# Patient Record
Sex: Male | Born: 1955 | Race: Black or African American | Hispanic: No | Marital: Single | Smoking: Never smoker
Health system: Southern US, Community
[De-identification: ages and names within clinical notes are randomized; demographics above are authoritative.]

## PROBLEM LIST (undated history)

## (undated) DIAGNOSIS — F209 Schizophrenia, unspecified: Secondary | ICD-10-CM

## (undated) DIAGNOSIS — R9431 Abnormal electrocardiogram [ECG] [EKG]: Secondary | ICD-10-CM

## (undated) DIAGNOSIS — R0602 Shortness of breath: Secondary | ICD-10-CM

## (undated) DIAGNOSIS — F79 Unspecified intellectual disabilities: Secondary | ICD-10-CM

---

## 1997-08-17 ENCOUNTER — Encounter: Admission: RE | Admit: 1997-08-17 | Discharge: 1997-08-17 | Payer: Self-pay | Admitting: Family Medicine

## 2009-08-17 ENCOUNTER — Emergency Department (HOSPITAL_COMMUNITY): Admission: EM | Admit: 2009-08-17 | Discharge: 2009-08-17 | Payer: Self-pay | Admitting: Emergency Medicine

## 2010-07-10 LAB — POCT I-STAT, CHEM 8
Creatinine, Ser: 1.1 mg/dL (ref 0.4–1.5)
HCT: 47 % (ref 39.0–52.0)
Hemoglobin: 16 g/dL (ref 13.0–17.0)
Potassium: 3.4 mEq/L — ABNORMAL LOW (ref 3.5–5.1)
Sodium: 137 mEq/L (ref 135–145)
TCO2: 25 mmol/L (ref 0–100)

## 2010-11-28 ENCOUNTER — Ambulatory Visit (INDEPENDENT_AMBULATORY_CARE_PROVIDER_SITE_OTHER): Payer: Medicare Other

## 2010-11-28 ENCOUNTER — Inpatient Hospital Stay (INDEPENDENT_AMBULATORY_CARE_PROVIDER_SITE_OTHER)
Admission: RE | Admit: 2010-11-28 | Discharge: 2010-11-28 | Disposition: A | Discharge status: Home / Self Care | Payer: Medicare Other | Source: Ambulatory Visit | Attending: Emergency Medicine | Admitting: Emergency Medicine

## 2010-11-28 DIAGNOSIS — S63259A Unspecified dislocation of unspecified finger, initial encounter: Secondary | ICD-10-CM

## 2011-01-30 ENCOUNTER — Inpatient Hospital Stay (INDEPENDENT_AMBULATORY_CARE_PROVIDER_SITE_OTHER)
Admission: RE | Admit: 2011-01-30 | Discharge: 2011-01-30 | Disposition: A | Discharge status: Home / Self Care | Payer: Medicare Other | Source: Ambulatory Visit | Attending: Family Medicine | Admitting: Family Medicine

## 2011-09-13 DIAGNOSIS — F209 Schizophrenia, unspecified: Secondary | ICD-10-CM | POA: Diagnosis not present

## 2011-09-30 DIAGNOSIS — F209 Schizophrenia, unspecified: Secondary | ICD-10-CM | POA: Diagnosis not present

## 2012-03-10 DIAGNOSIS — Z13220 Encounter for screening for lipoid disorders: Secondary | ICD-10-CM | POA: Diagnosis not present

## 2012-03-10 DIAGNOSIS — F411 Generalized anxiety disorder: Secondary | ICD-10-CM | POA: Diagnosis not present

## 2012-03-11 DIAGNOSIS — H539 Unspecified visual disturbance: Secondary | ICD-10-CM | POA: Diagnosis not present

## 2012-03-11 DIAGNOSIS — Z131 Encounter for screening for diabetes mellitus: Secondary | ICD-10-CM | POA: Diagnosis not present

## 2012-03-11 DIAGNOSIS — Z136 Encounter for screening for cardiovascular disorders: Secondary | ICD-10-CM | POA: Diagnosis not present

## 2012-03-26 DIAGNOSIS — F209 Schizophrenia, unspecified: Secondary | ICD-10-CM | POA: Diagnosis not present

## 2012-04-02 DIAGNOSIS — H538 Other visual disturbances: Secondary | ICD-10-CM | POA: Diagnosis not present

## 2012-07-16 DIAGNOSIS — F209 Schizophrenia, unspecified: Secondary | ICD-10-CM | POA: Diagnosis not present

## 2012-09-10 DIAGNOSIS — Z131 Encounter for screening for diabetes mellitus: Secondary | ICD-10-CM | POA: Diagnosis not present

## 2012-09-10 DIAGNOSIS — R259 Unspecified abnormal involuntary movements: Secondary | ICD-10-CM | POA: Diagnosis not present

## 2012-09-10 DIAGNOSIS — Z136 Encounter for screening for cardiovascular disorders: Secondary | ICD-10-CM | POA: Diagnosis not present

## 2012-09-15 DIAGNOSIS — R259 Unspecified abnormal involuntary movements: Secondary | ICD-10-CM | POA: Diagnosis not present

## 2012-10-29 DIAGNOSIS — R259 Unspecified abnormal involuntary movements: Secondary | ICD-10-CM | POA: Diagnosis not present

## 2012-11-02 DIAGNOSIS — F209 Schizophrenia, unspecified: Secondary | ICD-10-CM | POA: Diagnosis not present

## 2012-12-26 ENCOUNTER — Encounter (HOSPITAL_COMMUNITY): Payer: Self-pay | Admitting: Emergency Medicine

## 2012-12-26 ENCOUNTER — Emergency Department (HOSPITAL_COMMUNITY)
Admission: EM | Admit: 2012-12-26 | Discharge: 2012-12-27 | Disposition: A | Discharge status: Home / Self Care | Payer: Medicare Other | Attending: Emergency Medicine | Admitting: Emergency Medicine

## 2012-12-26 DIAGNOSIS — Z79899 Other long term (current) drug therapy: Secondary | ICD-10-CM | POA: Insufficient documentation

## 2012-12-26 DIAGNOSIS — R42 Dizziness and giddiness: Secondary | ICD-10-CM | POA: Diagnosis not present

## 2012-12-26 DIAGNOSIS — T699XXA Effect of reduced temperature, unspecified, initial encounter: Secondary | ICD-10-CM | POA: Diagnosis not present

## 2012-12-26 DIAGNOSIS — E860 Dehydration: Secondary | ICD-10-CM | POA: Insufficient documentation

## 2012-12-26 DIAGNOSIS — F209 Schizophrenia, unspecified: Secondary | ICD-10-CM | POA: Insufficient documentation

## 2012-12-26 DIAGNOSIS — R112 Nausea with vomiting, unspecified: Secondary | ICD-10-CM | POA: Diagnosis not present

## 2012-12-26 HISTORY — DX: Unspecified intellectual disabilities: F79

## 2012-12-26 HISTORY — DX: Schizophrenia, unspecified: F20.9

## 2012-12-26 LAB — BASIC METABOLIC PANEL
BUN: 10 mg/dL (ref 6–23)
CO2: 27 mEq/L (ref 19–32)
Calcium: 8.2 mg/dL — ABNORMAL LOW (ref 8.4–10.5)
Chloride: 105 mEq/L (ref 96–112)
Creatinine, Ser: 1.11 mg/dL (ref 0.50–1.35)
GFR calc Af Amer: 84 mL/min — ABNORMAL LOW (ref >90)
GFR calc non Af Amer: 72 mL/min — ABNORMAL LOW (ref >90)
Potassium: 3.8 mEq/L (ref 3.5–5.1)
Sodium: 139 mEq/L (ref 135–145)

## 2012-12-26 LAB — CBC
Hemoglobin: 12 g/dL — ABNORMAL LOW (ref 13.0–17.0)
MCH: 23.4 pg — ABNORMAL LOW (ref 26.0–34.0)
Platelets: 135 10*3/uL — ABNORMAL LOW (ref 150–400)
RBC: 5.13 MIL/uL (ref 4.22–5.81)
WBC: 6.8 10*3/uL (ref 4.0–10.5)

## 2012-12-26 MED ORDER — ONDANSETRON HCL 4 MG/2ML IJ SOLN
4.0000 mg | Freq: Once | INTRAMUSCULAR | Status: AC
  Administered 2012-12-26: 4 mg via INTRAVENOUS
  Filled 2012-12-26: qty 2

## 2012-12-26 MED ORDER — SODIUM CHLORIDE 0.9 % IV BOLUS (SEPSIS)
1000.0000 mL | Freq: Once | INTRAVENOUS | Status: AC
  Administered 2012-12-26: 1000 mL via INTRAVENOUS

## 2012-12-26 NOTE — ED Provider Notes (Signed)
CSN: [id number]     Arrival date & time 12/26/12  1905 History   First MD Initiated Contact with Patient 12/26/12 2048     Chief Complaint  Patient presents with  . Dizziness  . Nausea  . Emesis   (Consider location/radiation/quality/duration/timing/severity/associated sxs/prior Treatment) HPI 57 year old male with a past medical history of schizophrenia that presents for evaluation due to an episode that involved nausea, one episode of NB/NB emesis and "feeling hot".  The patient was under a bridge eating lunch with some of his friends at the time. He denies using any illicit drugs today. He does report that he was outside in the sun for a significant portion of the day. He did not have any chest pain, shortness of breath, abdominal pain or other complaints at the time. He reports he is feeling better currently. He reports his friends gave him water and tried to cool him down.  Of note he is a poor historian and is unable to provide many details.     Past Medical History  Diagnosis Date  . Schizophrenia   . Mentally challenged    No past surgical history on file. No family history on file. History  Substance Use Topics  . Smoking status: Not on file  . Smokeless tobacco: Not on file  . Alcohol Use: Not on file    Review of Systems  Constitutional: Negative for fever and chills.  HENT: Negative for neck pain.   Eyes: Negative for photophobia and visual disturbance.  Respiratory: Negative for chest tightness and shortness of breath.   Cardiovascular: Negative for chest pain and palpitations.  Gastrointestinal: Negative for abdominal pain and diarrhea.  Genitourinary: Negative for dysuria and frequency.  Musculoskeletal: Negative for myalgias and back pain.  Skin: Negative for rash.  Neurological: Negative for weakness, numbness and headaches.  All other systems reviewed and are negative.    Allergies  Review of patient's allergies indicates no known allergies.  Home  Medications   Current Outpatient Rx  Name  Route  Sig  Dispense  Refill  . ARIPiprazole (ABILIFY) 5 MG tablet   Oral   Take 5 mg by mouth daily.         . benztropine (COGENTIN) 1 MG tablet   Oral   Take 1 mg by mouth daily at 8 pm.         . LORazepam (ATIVAN) 0.5 MG tablet   Oral   Take 0.5 mg by mouth 2 (two) times daily.          BP 103/66  Pulse 60  Temp(Src) 98.5 F (36.9 C) (Oral)  Resp 16  SpO2 98% Physical Exam  Vitals reviewed. Constitutional: He is oriented to person, place, and time. He appears well-developed and well-nourished. No distress.  HENT:  Head: Normocephalic.  Right Ear: External ear normal.  Left Ear: External ear normal.  Nose: Nose normal.  Mouth/Throat: Oropharynx is clear and moist. No oropharyngeal exudate.  Eyes: Conjunctivae and EOM are normal. Pupils are equal, round, and reactive to light.  Neck: Normal range of motion. Neck supple.  Cardiovascular: Normal rate, regular rhythm, normal heart sounds and intact distal pulses.  Exam reveals no gallop and no friction rub.   No murmur heard. Pulmonary/Chest: Effort normal and breath sounds normal. No respiratory distress.  Abdominal: Soft. Bowel sounds are normal. He exhibits no distension. There is no tenderness.  Musculoskeletal: Normal range of motion. He exhibits no edema and no tenderness.  Neurological: He is alert and  oriented to person, place, and time. He has normal strength. No cranial nerve deficit or sensory deficit.  Skin: Skin is warm and dry.  Psychiatric: He has a normal mood and affect.    ED Course  Procedures (including critical care time) Labs Review Labs Reviewed  CBC - Abnormal; Notable for the following:    Hemoglobin 12.0 (*)    HCT 35.9 (*)    MCV 70.0 (*)    MCH 23.4 (*)    Platelets 135 (*)    All other components within normal limits  BASIC METABOLIC PANEL - Abnormal; Notable for the following:    Calcium 8.2 (*)    GFR calc non Af Amer 72 (*)     GFR calc Af Amer 84 (*)    All other components within normal limits  CG4 I-STAT (LACTIC ACID)   Imaging Review No results found.   Date: 12/26/2012  Rate: 65  Rhythm: normal sinus rhythm  QRS Axis: normal  Intervals: normal  ST/T Wave abnormalities: normal  Conduction Disutrbances:left anterior fascicular block  Narrative Interpretation: NSR, LAFB, otherwise normal  Old EKG Reviewed: none available   MDM   57 year old male with a past medical history of schizophrenia here for evaluation after a self-limited episode of nausea and vomiting. It is felt that he likely has mild dehydration and possibly had a near syncopal/vasovagal event.  He had no other associated symptoms at the time.  He reports feeling better currently. He is noted to be a poor historian however. Exam is reassuring. Neuro exam is nonfocal. Blood pressure is improving. We'll administer additional IV fluids and anti-emetics. CBC, BMP and EKG pending.  11:14 PM  Patient's blood pressure transiently improved to >100 with IV fluid resuscitation, but has trended back down to the 90s/60s. He reports feeling better. Lactate ordered. I reviewed his chart, but was unable to find any documentation of prior blood pressures. He is unaware of what his normal blood pressure is. Creatinine is normal. He is mentating appropriately. He does not have symptoms with standing.  His heart rate is in the 60s he does not take any rate control medications. It is felt he may live in this range. Her temperature was rechecked and found to be 99.4. If his lactate is normal is felt that no further workup is indicated during this visit the  11:45 PM lactate was normal. Patient reports feeling better and is ready for discharge. It is felt that he is stable for outpatient followup. Return precautions reviewed.  He ambulated from the emergency department with no issues.  Clinical Impression: 1. Dehydration     Disposition: Discharge  Condition:  Good  I have discussed the results, Dx and Tx plan. They expressed understanding and agree with the plan and were told to return to ED with any worsening of condition or concern.    Discharge Medication List as of 12/26/2012 11:42 PM      Follow Up: [facility]     [address] [place] [address]  Schedule an appointment as soon as possible for a visit    Pt seen in conjunction with Dr. Ethelda Chick.  Reine Just. Beverely Pace, MD Emergency Medicine PGY-III [phone]    Oleh Genin, MD 12/27/12 [phone]

## 2012-12-26 NOTE — ED Notes (Signed)
pts belongings placed in pts belongings bag at bedside. Items include three shirt a pair of jeans shoes and sunglasses and a hat.

## 2012-12-26 NOTE — ED Notes (Signed)
Beverely Pace MD at bedside.

## 2012-12-26 NOTE — ED Provider Notes (Signed)
Patient became lightheaded generally weak and nauseated today while in a hot environment. He feels much improved after treatment in the emergency department. He is not lightheaded on standing.  Doug Sou, MD 12/26/12 [phone]

## 2012-12-26 NOTE — ED Notes (Addendum)
Received pt via EMS with c/o dizziness with n/v onset sometime today. Pt unable to tell exact time symptoms started. Pt reports vomited x 4 today. Pt lives in group home at [address] but was picked up by EMS downtown. Pt denies any symptoms at present. Contact person at group home: Deatra Ina [phone].

## 2012-12-27 NOTE — ED Provider Notes (Signed)
I have personally seen and examined the patient.  I have discussed the plan of care with the resident.  I have reviewed the documentation on PMH/FH/Soc. History.  I have reviewed the documentation of the resident and agree.  Valerya Maxton, MD 12/27/12 2346 

## 2013-01-05 DIAGNOSIS — F209 Schizophrenia, unspecified: Secondary | ICD-10-CM | POA: Diagnosis not present

## 2013-02-25 DIAGNOSIS — F209 Schizophrenia, unspecified: Secondary | ICD-10-CM | POA: Diagnosis not present

## 2013-03-02 DIAGNOSIS — F2089 Other schizophrenia: Secondary | ICD-10-CM | POA: Diagnosis not present

## 2013-03-02 DIAGNOSIS — F411 Generalized anxiety disorder: Secondary | ICD-10-CM | POA: Diagnosis not present

## 2013-03-02 DIAGNOSIS — R259 Unspecified abnormal involuntary movements: Secondary | ICD-10-CM | POA: Diagnosis not present

## 2013-03-30 DIAGNOSIS — F209 Schizophrenia, unspecified: Secondary | ICD-10-CM | POA: Diagnosis not present

## 2013-06-18 ENCOUNTER — Encounter (HOSPITAL_COMMUNITY)
Admission: EM | Disposition: A | Discharge status: Other Acute Inpatient Hospital | Payer: Self-pay | Source: Home / Self Care | Attending: Interventional Cardiology

## 2013-06-18 ENCOUNTER — Observation Stay (HOSPITAL_COMMUNITY): Payer: Medicare Other

## 2013-06-18 ENCOUNTER — Encounter (HOSPITAL_COMMUNITY): Payer: Self-pay | Admitting: Emergency Medicine

## 2013-06-18 ENCOUNTER — Inpatient Hospital Stay (HOSPITAL_COMMUNITY)
Admission: EM | Admit: 2013-06-18 | Discharge: 2013-06-20 | DRG: 310 | Payer: Medicare Other | Attending: Interventional Cardiology | Admitting: Interventional Cardiology

## 2013-06-18 DIAGNOSIS — R9431 Abnormal electrocardiogram [ECG] [EKG]: Secondary | ICD-10-CM

## 2013-06-18 DIAGNOSIS — F79 Unspecified intellectual disabilities: Secondary | ICD-10-CM | POA: Diagnosis not present

## 2013-06-18 DIAGNOSIS — Z79899 Other long term (current) drug therapy: Secondary | ICD-10-CM

## 2013-06-18 DIAGNOSIS — F209 Schizophrenia, unspecified: Secondary | ICD-10-CM | POA: Insufficient documentation

## 2013-06-18 DIAGNOSIS — R0789 Other chest pain: Secondary | ICD-10-CM | POA: Diagnosis not present

## 2013-06-18 DIAGNOSIS — R42 Dizziness and giddiness: Secondary | ICD-10-CM

## 2013-06-18 DIAGNOSIS — I209 Angina pectoris, unspecified: Secondary | ICD-10-CM | POA: Diagnosis not present

## 2013-06-18 DIAGNOSIS — I10 Essential (primary) hypertension: Secondary | ICD-10-CM | POA: Diagnosis present

## 2013-06-18 DIAGNOSIS — R404 Transient alteration of awareness: Secondary | ICD-10-CM | POA: Diagnosis not present

## 2013-06-18 DIAGNOSIS — R0602 Shortness of breath: Secondary | ICD-10-CM | POA: Insufficient documentation

## 2013-06-18 DIAGNOSIS — R072 Precordial pain: Secondary | ICD-10-CM | POA: Diagnosis present

## 2013-06-18 DIAGNOSIS — I4510 Unspecified right bundle-branch block: Secondary | ICD-10-CM | POA: Diagnosis not present

## 2013-06-18 DIAGNOSIS — I208 Other forms of angina pectoris: Secondary | ICD-10-CM | POA: Diagnosis present

## 2013-06-18 DIAGNOSIS — I959 Hypotension, unspecified: Secondary | ICD-10-CM | POA: Diagnosis not present

## 2013-06-18 DIAGNOSIS — R079 Chest pain, unspecified: Secondary | ICD-10-CM | POA: Diagnosis not present

## 2013-06-18 DIAGNOSIS — I2089 Other forms of angina pectoris: Secondary | ICD-10-CM | POA: Diagnosis present

## 2013-06-18 DIAGNOSIS — I951 Orthostatic hypotension: Secondary | ICD-10-CM | POA: Diagnosis not present

## 2013-06-18 HISTORY — DX: Abnormal electrocardiogram (ECG) (EKG): R94.31

## 2013-06-18 HISTORY — DX: Shortness of breath: R06.02

## 2013-06-18 HISTORY — PX: LEFT HEART CATHETERIZATION WITH CORONARY ANGIOGRAM: SHX5451

## 2013-06-18 LAB — CBC WITH DIFFERENTIAL/PLATELET
BASOS ABS: 0 10*3/uL (ref 0.0–0.1)
Basophils Relative: 0 % (ref 0–1)
EOS PCT: 0 % (ref 0–5)
Eosinophils Absolute: 0 10*3/uL (ref 0.0–0.7)
HEMATOCRIT: 39.8 % (ref 39.0–52.0)
Hemoglobin: 13.6 g/dL (ref 13.0–17.0)
LYMPHS ABS: 0.2 10*3/uL — AB (ref 0.7–4.0)
LYMPHS PCT: 3 % — AB (ref 12–46)
MCH: 23.9 pg — ABNORMAL LOW (ref 26.0–34.0)
MCHC: 34.2 g/dL (ref 30.0–36.0)
MCV: 69.9 fL — AB (ref 78.0–100.0)
MONOS PCT: 5 % (ref 3–12)
Monocytes Absolute: 0.3 10*3/uL (ref 0.1–1.0)
NEUTROS ABS: 5.8 10*3/uL (ref 1.7–7.7)
Neutrophils Relative %: 92 % — ABNORMAL HIGH (ref 43–77)
Platelets: 170 10*3/uL (ref 150–400)
RBC: 5.69 MIL/uL (ref 4.22–5.81)
RDW: 13.4 % (ref 11.5–15.5)
WBC: 6.3 10*3/uL (ref 4.0–10.5)

## 2013-06-18 LAB — TSH: TSH: 0.913 u[IU]/mL (ref 0.350–4.500)

## 2013-06-18 LAB — BASIC METABOLIC PANEL
BUN: 11 mg/dL (ref 6–23)
CHLORIDE: 97 meq/L (ref 96–112)
CO2: 25 meq/L (ref 19–32)
CREATININE: 1.18 mg/dL (ref 0.50–1.35)
Calcium: 8.9 mg/dL (ref 8.4–10.5)
GFR calc non Af Amer: 67 mL/min — ABNORMAL LOW (ref >90)
GFR, EST AFRICAN AMERICAN: 77 mL/min — AB (ref >90)
Glucose, Bld: 87 mg/dL (ref 70–99)
POTASSIUM: 4.5 meq/L (ref 3.7–5.3)
Sodium: 133 mEq/L — ABNORMAL LOW (ref 137–147)

## 2013-06-18 LAB — I-STAT TROPONIN, ED: TROPONIN I, POC: 0 ng/mL (ref 0.00–0.08)

## 2013-06-18 LAB — MRSA PCR SCREENING: MRSA by PCR: NEGATIVE

## 2013-06-18 LAB — TROPONIN I
Troponin I: <0.3 ng/mL (ref <0.30)
Troponin I: <0.3 ng/mL (ref <0.30)

## 2013-06-18 SURGERY — LEFT HEART CATHETERIZATION WITH CORONARY ANGIOGRAM
Anesthesia: LOCAL

## 2013-06-18 MED ORDER — LORAZEPAM 0.5 MG PO TABS
0.5000 mg | ORAL_TABLET | Freq: Two times a day (BID) | ORAL | Status: DC
  Administered 2013-06-18 – 2013-06-20 (×4): 0.5 mg via ORAL
  Filled 2013-06-18 (×4): qty 1

## 2013-06-18 MED ORDER — ACETAMINOPHEN 325 MG PO TABS: 650.0000 mg | ORAL_TABLET | ORAL | Status: DC | PRN

## 2013-06-18 MED ORDER — ASPIRIN 81 MG PO CHEW
324.0000 mg | CHEWABLE_TABLET | Freq: Once | ORAL | Status: AC
  Administered 2013-06-18: 324 mg via ORAL
  Filled 2013-06-18: qty 4

## 2013-06-18 MED ORDER — SODIUM CHLORIDE 0.9 % IV SOLN
Freq: Once | INTRAVENOUS | Status: AC
  Administered 2013-06-18: 15:00:00 via INTRAVENOUS

## 2013-06-18 MED ORDER — ARIPIPRAZOLE 5 MG PO TABS
5.0000 mg | ORAL_TABLET | Freq: Every day | ORAL | Status: DC
  Administered 2013-06-19 – 2013-06-20 (×2): 5 mg via ORAL
  Filled 2013-06-18 (×2): qty 1

## 2013-06-18 MED ORDER — BENZTROPINE MESYLATE 1 MG PO TABS
1.0000 mg | ORAL_TABLET | Freq: Every day | ORAL | Status: DC
  Administered 2013-06-18 – 2013-06-19 (×2): 1 mg via ORAL
  Filled 2013-06-18 (×3): qty 1

## 2013-06-18 MED ORDER — ONDANSETRON HCL 4 MG/2ML IJ SOLN: 4.0000 mg | Freq: Four times a day (QID) | INTRAMUSCULAR | Status: DC | PRN

## 2013-06-18 MED ORDER — ENOXAPARIN SODIUM 150 MG/ML ~~LOC~~ SOLN: 1.0000 mg/kg | Freq: Two times a day (BID) | SUBCUTANEOUS | Status: DC

## 2013-06-18 MED ORDER — ALPRAZOLAM 0.25 MG PO TABS: 0.2500 mg | ORAL_TABLET | Freq: Two times a day (BID) | ORAL | Status: DC | PRN

## 2013-06-18 MED ORDER — ZOLPIDEM TARTRATE 5 MG PO TABS: 5.0000 mg | ORAL_TABLET | Freq: Every evening | ORAL | Status: DC | PRN

## 2013-06-18 MED ORDER — SODIUM CHLORIDE 0.9 % IV SOLN
Freq: Once | INTRAVENOUS | Status: AC
  Administered 2013-06-18: 19:00:00 via INTRAVENOUS

## 2013-06-18 MED ORDER — ASPIRIN EC 81 MG PO TBEC
81.0000 mg | DELAYED_RELEASE_TABLET | Freq: Every day | ORAL | Status: DC
  Administered 2013-06-19 – 2013-06-20 (×2): 81 mg via ORAL
  Filled 2013-06-18 (×2): qty 1

## 2013-06-18 MED ORDER — ENOXAPARIN SODIUM 60 MG/0.6ML ~~LOC~~ SOLN
60.0000 mg | Freq: Two times a day (BID) | SUBCUTANEOUS | Status: DC
  Administered 2013-06-18 – 2013-06-20 (×3): 60 mg via SUBCUTANEOUS
  Filled 2013-06-18 (×6): qty 0.6

## 2013-06-18 NOTE — ED Notes (Signed)
Nurse first called; [facility] Per [facility], group home coordinator to come.

## 2013-06-18 NOTE — ED Notes (Signed)
PA with Cardiology paged for consult on pt low BP 88/57.

## 2013-06-18 NOTE — H&P (Signed)
History and Physical   Patient ID: Terry Schroeder MRN: [id number], DOB/AGE: 1955-06-23 58 y.o. Date of Encounter: 06/18/2013  Primary Physician: No primary provider on file. Primary Cardiologist: New  Chief Complaint:  Possible MI  HPI: Terry Schroeder is a 58 y.o. male with no history of CAD. He lives at a group home. The caregiver provided most of the information as the patient is not able to remember details very well.   According to her, he was as usual today first thing. He ate a good breakfast and took his usual medications. He had no complaints. When he went to the day room, he began shaking. She does not remember any other complaints. The patient says he was dizzy, c/o cramping in his throat, and SOB. It is unclear if he told anyone that. EMS was called because of the shaking. He never lost consciousness and was able to answer questions.   His ECG had some possible dynamic changes, but it improved by arrival in the ER. In the ER, he was given ASA 81 mg x 4. He still says he is dizzy and has intermittent cramping in his throat. A little SOB, no other symptoms.   Past Medical History  Diagnosis Date  . Schizophrenia   . Mentally challenged     Surgical History:  None known   I have reviewed the patient's current medications. Prior to Admission medications   Medication Sig Start Date End Date Taking? Authorizing Provider  ARIPiprazole (ABILIFY) 5 MG tablet Take 5 mg by mouth daily.    Historical Provider, MD  benztropine (COGENTIN) 1 MG tablet Take 1 mg by mouth at bedtime.     Historical Provider, MD  LORazepam (ATIVAN) 0.5 MG tablet Take 0.5 mg by mouth 2 (two) times daily.    Historical Provider, MD   Allergies: No Known Allergies  History   Social History  . Marital Status: Single    Spouse Name: N/A    Number of Children: N/A  . Years of Education: N/A   Occupational History  . Disabled    Social History Main Topics  . Smoking status: Never Smoker     . Smokeless tobacco: Not on file  . Alcohol Use: No  . Drug Use: No  . Sexual Activity: Not on file   Other Topics Concern  . Not on file   Social History Narrative   Lives in a group home. No information on family history available.     Review of Systems:   Full 14-point review of systems otherwise negative except as noted above.  Physical Exam: Blood pressure 95/66, pulse 70, temperature 98.3 F (36.8 C), temperature source Oral, resp. rate 17, SpO2 98.00%. General: Well developed, well nourished,male in no acute distress. Head: Normocephalic, atraumatic, sclera non-icteric, no xanthomas, nares are without discharge. Dentition: good Neck: No carotid bruits. JVD not elevated. No thyromegally Lungs: Good expansion bilaterally. without wheezes or rhonchi.  Heart: Regular rate and rhythm with S1 S2.  No S3 or S4.  No murmur, no rubs, or gallops appreciated. Abdomen: Soft, non-tender, non-distended with normoactive bowel sounds. No hepatomegaly. No rebound/guarding. No obvious abdominal masses. Msk:  Strength and tone appear normal for age. No joint deformities or effusions, no spine or costo-vertebral angle tenderness. Extremities: No clubbing or cyanosis. No edema.  Distal pedal pulses are 2+ in 4 extrem Neuro: Alert and oriented X 2. Moves all extremities spontaneously. No focal deficits noted. Psych:  Responds to questions  appropriately with a normal affect. Skin: No rashes or lesions noted  Labs:   Lab Results  Component Value Date   WBC 6.3 06/18/2013   HGB 13.6 06/18/2013   HCT 39.8 06/18/2013   MCV 69.9* 06/18/2013   PLT 170 06/18/2013     Recent Labs Lab 06/18/13 1139  NA 133*  K 4.5  CL 97  CO2 25  BUN 11  CREATININE 1.18  CALCIUM 8.9  GLUCOSE 87     Recent Labs  06/18/13 1151  TROPIPOC 0.00    Radiology/Studies: No results found.   ECG: SR, no STEMI  ASSESSMENT AND PLAN:  Possible MI - admit, cycle enzymes, use lovenox, continue to follow ECG, ck  echo. If enzymes elevate or ECG becomes more abnormal, cath. Otherwise, MD advise.   Schizophrenia - continue home Rx.  Signed, Theodore DemarkRhonda Barrett, PA-C 06/18/2013 1:20 PM Beeper [phone]   I have examined the patient and reviewed assessment and plan and discussed with patient, emergently as he was initially called as a STEMI.  Agree with above as stated.  His ECG is somewhat different from his prior ECG but not clearly showing an anterior ST elevation MI. He is not currently reporting any chest discomfort. His primary complaint is dizziness. His cardiac exam is unremarkable. Will check echocardiogram to evaluate his dizziness and mild hypotension. Will give IV hydration as well. Hypertension may be the cause of his dizziness. If his enzymes continue to be negative, we'll plan on conservative management, especially given his other medical and psychiatric issues. If he continues to have symptoms, then we will have to pursue some type of ischemic workup. Only if he rules in what I plan for cardiac catheterization.    Yesenia Fontenette S.

## 2013-06-18 NOTE — ED Notes (Addendum)
Pt lives in group home; complaining of dizziness when woke up this morning; syncopal episode. EMS on scene noticed elevation on EKG. Pt no chest pain complaints, nausea, or diaphoresis. Pt only complaining of dizziness. EMS started IV. No meds given.

## 2013-06-18 NOTE — Progress Notes (Signed)
  Echocardiogram 2D Echocardiogram has been performed.  Cathie BeamsGREGORY, Renate Danh 06/18/2013, 3:59 PM

## 2013-06-18 NOTE — Progress Notes (Signed)
ANTICOAGULATION CONSULT NOTE - Initial Consult  Pharmacy Consult for Lovenox Indication: chest pain/ACS  No Known Allergies  Patient Measurements: Height: 5\' 9"  (175.3 cm) Weight: 126 lb 1.7 oz (57.2 kg) IBW/kg (Calculated) : 70.7 Heparin Dosing Weight:   Vital Signs: Temp: 98.3 F (36.8 C) (02/27 1113) Temp src: Oral (02/27 1113) BP: 98/61 mmHg (02/27 1645) Pulse Rate: 56 (02/27 1645)  Labs:  Recent Labs  06/18/13 1139 06/18/13 1438  HGB 13.6  --   HCT 39.8  --   PLT 170  --   CREATININE 1.18  --   TROPONINI  --  <0.30    Estimated Creatinine Clearance: 55.9 ml/min (by C-G formula based on Cr of 1.18).   Medical History: Past Medical History  Diagnosis Date  . Schizophrenia   . Mentally challenged   . Shortness of breath   . Nonspecific abnormal electrocardiogram (ECG) (EKG)     Medications:  Prescriptions prior to admission  Medication Sig Dispense Refill  . ARIPiprazole (ABILIFY) 5 MG tablet Take 5 mg by mouth daily.      . benztropine (COGENTIN) 1 MG tablet Take 1 mg by mouth at bedtime.       Marland Kitchen. LORazepam (ATIVAN) 0.5 MG tablet Take 0.5 mg by mouth 2 (two) times daily.       Scheduled:  . [START ON 06/19/2013] ARIPiprazole  5 mg Oral Daily  . [START ON 06/19/2013] aspirin EC  81 mg Oral Daily  . benztropine  1 mg Oral QHS  . LORazepam  0.5 mg Oral BID   Infusions:    Assessment: 58 yo who was admitted for possible MI. Starting lovenox now while awaiting poss cath. Baseline CBC wnl.  Goal of Therapy:  Anti-Xa level 0.6-1 units/ml 4hrs after LMWH dose given Monitor platelets by anticoagulation protocol: Yes   Plan:   Lovenox 60mg  SQ q12 CBC q72h

## 2013-06-18 NOTE — ED Notes (Signed)
Code stemi cancelled by Cardiology.

## 2013-06-18 NOTE — ED Notes (Signed)
EDP and Cardiology at bedside.

## 2013-06-18 NOTE — ED Notes (Signed)
Cardiology MD at bedside for assessment

## 2013-06-18 NOTE — ED Notes (Signed)
Pt. Returned from echo

## 2013-06-18 NOTE — ED Provider Notes (Signed)
CSN: [id number]     Arrival date & time 06/18/13  1103 History   First MD Initiated Contact with Patient 06/18/13 1106     No chief complaint on file.    (Consider location/radiation/quality/duration/timing/severity/associated sxs/prior Treatment) Patient is a 58 y.o. male presenting with syncope. The history is provided by the patient. The history is limited by a developmental delay.  Loss of Consciousness Episode history:  Single Most recent episode:  Today Duration: unknown. Timing: once. Progression:  Resolved Chronicity:  New Context comment:  Passed out while in the shower Witnessed: no   Relieved by:  Nothing Worsened by:  Nothing tried Associated symptoms: dizziness and nausea   Associated symptoms: no chest pain, no shortness of breath and no vomiting     Past Medical History  Diagnosis Date  . Schizophrenia   . Mentally challenged    No past surgical history on file. No family history on file. History  Substance Use Topics  . Smoking status: Not on file  . Smokeless tobacco: Not on file  . Alcohol Use: Not on file    Review of Systems  Respiratory: Negative for shortness of breath.   Cardiovascular: Positive for syncope. Negative for chest pain.  Gastrointestinal: Positive for nausea. Negative for vomiting.  Neurological: Positive for dizziness.  All other systems reviewed and are negative.      Allergies  Review of patient's allergies indicates no known allergies.  Home Medications   Current Outpatient Rx  Name  Route  Sig  Dispense  Refill  . ARIPiprazole (ABILIFY) 5 MG tablet   Oral   Take 5 mg by mouth daily.         . benztropine (COGENTIN) 1 MG tablet   Oral   Take 1 mg by mouth at bedtime.          Marland Kitchen LORazepam (ATIVAN) 0.5 MG tablet   Oral   Take 0.5 mg by mouth 2 (two) times daily.          BP 98/61  Pulse 56  Temp(Src) 98.3 F (36.8 C) (Oral)  Resp 15  Ht 5\' 9"  (1.753 m)  Wt 126 lb 1.7 oz (57.2 kg)  BMI 18.61 kg/m2   SpO2 100% Physical Exam  Nursing note and vitals reviewed. Constitutional: He is oriented to person, place, and time. He appears well-developed and well-nourished. No distress.  Thin  HENT:  Head: Normocephalic and atraumatic.  Mouth/Throat: Oropharynx is clear and moist.  Eyes: Conjunctivae are normal. Pupils are equal, round, and reactive to light. No scleral icterus.  Neck: Neck supple.  Cardiovascular: Normal rate, regular rhythm, normal heart sounds and intact distal pulses.   No murmur heard. Pulmonary/Chest: Effort normal and breath sounds normal. No stridor. No respiratory distress. He has no wheezes. He has no rales.  Abdominal: Soft. He exhibits no distension. There is no tenderness.  Musculoskeletal: Normal range of motion. He exhibits no edema.  Neurological: He is alert and oriented to person, place, and time.  Skin: Skin is warm and dry. No rash noted.  Psychiatric: He has a normal mood and affect. His behavior is normal.    ED Course  CRITICAL CARE Performed by: Blake Divine DAVID III Authorized by: Blake Divine DAVID III Total critical care time: 30 minutes Critical care time was exclusive of separately billable procedures and treating other patients. Critical care was necessary to treat or prevent imminent or life-threatening deterioration of the following conditions: cardiac failure. Critical care was time spent personally by  me on the following activities: development of treatment plan with patient or surrogate, discussions with consultants, evaluation of patient's response to treatment, examination of patient, obtaining history from patient or surrogate, ordering and performing treatments and interventions, ordering and review of laboratory studies, ordering and review of radiographic studies, pulse oximetry, re-evaluation of patient's condition and review of old charts.   (including critical care time) Labs Review Labs Reviewed  CBC WITH DIFFERENTIAL - Abnormal;  Notable for the following:    MCV 69.9 (*)    MCH 23.9 (*)    Neutrophils Relative % 92 (*)    Lymphocytes Relative 3 (*)    Lymphs Abs 0.2 (*)    All other components within normal limits  BASIC METABOLIC PANEL - Abnormal; Notable for the following:    Sodium 133 (*)    GFR calc non Af Amer 67 (*)    GFR calc Af Amer 77 (*)    All other components within normal limits  MRSA PCR SCREENING  TROPONIN I  TROPONIN I  TROPONIN I  TSH  URINE RAPID DRUG SCREEN (HOSP PERFORMED)  LIPID PANEL  CBC  I-STAT TROPOININ, ED   Imaging Review Dg Chest 2 View  06/18/2013   CLINICAL DATA:  Pain.  EXAM: CHEST  2 VIEW  COMPARISON:  None.  FINDINGS: Mediastinum and hilar structures are normal. Heart size normal. Lungs are clear of acute infiltrates. Findings suggesting COPD noted. A left perihilar pulmonary nodule cannot be excluded. This may just represent overlying vessels. Chest CT is suggested for further evaluation to exclude a true pulmonary nodule. No pleural effusion or pneumothorax. No acute bony abnormality.  IMPRESSION: Questionable left perihilar pulmonary nodule. Chest CT suggested for further evaluation.   Electronically Signed   By: Maisie Fushomas  Register   On: 06/18/2013 14:18  All radiology studies independently viewed by me.      EKG - sinus rhythm, rate 71, LAd, normal intervals, ST elevation in V1-V3, compared to prior, ST changes more pronounced.   MDM   Final diagnoses:  Shortness of breath  Nonspecific abnormal electrocardiogram (ECG) (EKG)  Mentally challenged  Schizophrenia  Dizziness  58 yo male with hx of schizophrenia and MR presenting after a syncope event.  He reports continued feeling dizzy.  EMS obtained an EKG that was worrisome for ST elevations, so Code STEMI called just prior to arrival.  He denied chest pain or SOB, but continued to complain of dizziness.  Cardiology evaluated him shortly after arrival and did not feel that he should go emergently to the Cath lab.   They did admit him for close monitoring and troponins.      Candyce ChurnJohn David Chigozie Basaldua III, MD 06/18/13 [phone]

## 2013-06-19 DIAGNOSIS — R42 Dizziness and giddiness: Secondary | ICD-10-CM

## 2013-06-19 DIAGNOSIS — I951 Orthostatic hypotension: Secondary | ICD-10-CM

## 2013-06-19 DIAGNOSIS — I959 Hypotension, unspecified: Secondary | ICD-10-CM | POA: Diagnosis not present

## 2013-06-19 DIAGNOSIS — Z79899 Other long term (current) drug therapy: Secondary | ICD-10-CM | POA: Diagnosis not present

## 2013-06-19 DIAGNOSIS — I4510 Unspecified right bundle-branch block: Secondary | ICD-10-CM | POA: Diagnosis not present

## 2013-06-19 DIAGNOSIS — F209 Schizophrenia, unspecified: Secondary | ICD-10-CM | POA: Diagnosis not present

## 2013-06-19 LAB — CBC
HCT: 35.4 % — ABNORMAL LOW (ref 39.0–52.0)
Hemoglobin: 11.9 g/dL — ABNORMAL LOW (ref 13.0–17.0)
MCH: 23.5 pg — ABNORMAL LOW (ref 26.0–34.0)
MCHC: 33.6 g/dL (ref 30.0–36.0)
MCV: 69.8 fL — AB (ref 78.0–100.0)
Platelets: 152 10*3/uL (ref 150–400)
RBC: 5.07 MIL/uL (ref 4.22–5.81)
RDW: 13.4 % (ref 11.5–15.5)
WBC: 3.3 10*3/uL — ABNORMAL LOW (ref 4.0–10.5)

## 2013-06-19 LAB — LIPID PANEL
Cholesterol: 110 mg/dL (ref 0–200)
HDL: 62 mg/dL (ref >39)
LDL CALC: 40 mg/dL (ref 0–99)
Total CHOL/HDL Ratio: 1.8 RATIO
Triglycerides: 40 mg/dL (ref <150)
VLDL: 8 mg/dL (ref 0–40)

## 2013-06-19 LAB — RAPID URINE DRUG SCREEN, HOSP PERFORMED
Amphetamines: NOT DETECTED
Barbiturates: NOT DETECTED
Benzodiazepines: NOT DETECTED
Cocaine: NOT DETECTED
OPIATES: NOT DETECTED
TETRAHYDROCANNABINOL: NOT DETECTED

## 2013-06-19 LAB — TROPONIN I

## 2013-06-19 MED ORDER — SODIUM CHLORIDE 0.9 % IV SOLN
INTRAVENOUS | Status: AC
  Administered 2013-06-19: 12:00:00 via INTRAVENOUS

## 2013-06-19 NOTE — Progress Notes (Signed)
Patient ID: Terry Schroeder, male   DOB: 03/10/1956, 58 y.o.   MRN: [id number]    SUBJECTIVE: SBP still low, drops into 80s at times.  He has had no further lightheadedness.  He has not had any chest pain or dyspnea.   Scheduled Meds: . ARIPiprazole  5 mg Oral Daily  . aspirin EC  81 mg Oral Daily  . benztropine  1 mg Oral QHS  . enoxaparin (LOVENOX) injection  60 mg Subcutaneous Q12H  . LORazepam  0.5 mg Oral BID   Continuous Infusions: . sodium chloride     PRN Meds:.acetaminophen, ALPRAZolam, ondansetron (ZOFRAN) IV, zolpidem    Filed Vitals:   06/19/13 0000 06/19/13 0300 06/19/13 0700 06/19/13 0800  BP: 86/48  83/56 87/54  Pulse: 68   85  Temp:  98.4 F (36.9 C)  98.3 F (36.8 C)  TempSrc:  Oral  Oral  Resp: 14  19 17   Height:      Weight:      SpO2: 96%   97%    Intake/Output Summary (Last 24 hours) at 06/19/13 1129 Last data filed at 06/19/13 1100  Gross per 24 hour  Intake   1290 ml  Output   2000 ml  Net   -710 ml    LABS: Basic Metabolic Panel:  Recent Labs  40/98/1102/27/15 1139  NA 133*  K 4.5  CL 97  CO2 25  GLUCOSE 87  BUN 11  CREATININE 1.18  CALCIUM 8.9   Liver Function Tests: No results found for this basename: AST, ALT, ALKPHOS, BILITOT, PROT, ALBUMIN,  in the last 72 hours No results found for this basename: LIPASE, AMYLASE,  in the last 72 hours CBC:  Recent Labs  06/18/13 1139 06/19/13 0117  WBC 6.3 3.3*  NEUTROABS 5.8  --   HGB 13.6 11.9*  HCT 39.8 35.4*  MCV 69.9* 69.8*  PLT 170 152   Cardiac Enzymes:  Recent Labs  06/18/13 1438 06/18/13 1905 06/19/13 0117  TROPONINI <0.30 <0.30 <0.30   BNP: No components found with this basename: POCBNP,  D-Dimer: No results found for this basename: DDIMER,  in the last 72 hours Hemoglobin A1C: No results found for this basename: HGBA1C,  in the last 72 hours Fasting Lipid Panel:  Recent Labs  06/19/13 0117  CHOL 110  HDL 62  LDLCALC 40  TRIG 40  CHOLHDL 1.8   Thyroid  Function Tests:  Recent Labs  06/18/13 1438  TSH 0.913   Anemia Panel: No results found for this basename: VITAMINB12, FOLATE, FERRITIN, TIBC, IRON, RETICCTPCT,  in the last 72 hours  RADIOLOGY: Dg Chest 2 View  06/18/2013   CLINICAL DATA:  Pain.  EXAM: CHEST  2 VIEW  COMPARISON:  None.  FINDINGS: Mediastinum and hilar structures are normal. Heart size normal. Lungs are clear of acute infiltrates. Findings suggesting COPD noted. A left perihilar pulmonary nodule cannot be excluded. This may just represent overlying vessels. Chest CT is suggested for further evaluation to exclude a true pulmonary nodule. No pleural effusion or pneumothorax. No acute bony abnormality.  IMPRESSION: Questionable left perihilar pulmonary nodule. Chest CT suggested for further evaluation.   Electronically Signed   By: Maisie Fushomas  Register   On: 06/18/2013 14:18    PHYSICAL EXAM General: NAD Neck: No JVD, no thyromegaly or thyroid nodule.  Lungs: Clear to auscultation bilaterally with normal respiratory effort. CV: Nondisplaced PMI.  Heart regular S1/S2, no S3/S4, no murmur.  No peripheral edema.  No carotid bruit.  Normal pedal pulses.  Abdomen: Soft, nontender, no hepatosplenomegaly, no distention.  Neurologic: Alert and oriented x 3.  Psych: Normal affect. Extremities: No clubbing or cyanosis.   TELEMETRY: Reviewed telemetry pt in NSR  ASSESSMENT AND PLAN: 58 yo with schizophrenia was sent to the ER with dizziness and was found to be mildly hypotensive.  Initially, there was concern in the ER for STEMI but it was thought that this was not a STEMI.  Patient has had no chest pain and cardiac enzymes have remained negative.  1. Abnormal ECG: Patient has an incomplete RBBB on ECG with some mild difference from prior ECGs.  It does not look like STEMI.  His cardiac enzymes were negative and he has had no chest pain.  Echo showed EF 50-55% with no major abnormalities.  His ECG yesterday had a "saddle-back" type  pattern in V2 (less so on today's ECG) that does raise concern for a Brugada pattern. It is interesting that a significant proportion of patients with schizophrenia do have a Brugada pattern on ECG (up to 10% or so on one report).  I do not think that the significance of this finding is known.  For now, do not think this will change treatment.  He does not have a history of arrhythmia.  2. Hypotension/dizziness: Dizziness has resolved. SBP still in 80s at times. Suspect mild dehydration.  Will give total 1 L NS over the day today.  No fever or leukocytosis.   3. He can go to floor today.  If stable tomorrow, back to group home.  I consulted care management.   Marca Ancona 06/19/2013 11:34 AM

## 2013-06-19 NOTE — Progress Notes (Signed)
Utilization Review Completed.   Allisyn Kunz, RN, BSN Nurse Case Manager  

## 2013-06-20 DIAGNOSIS — R9431 Abnormal electrocardiogram [ECG] [EKG]: Secondary | ICD-10-CM | POA: Diagnosis not present

## 2013-06-20 DIAGNOSIS — F209 Schizophrenia, unspecified: Secondary | ICD-10-CM | POA: Diagnosis not present

## 2013-06-20 DIAGNOSIS — R42 Dizziness and giddiness: Secondary | ICD-10-CM | POA: Diagnosis not present

## 2013-06-20 LAB — BASIC METABOLIC PANEL
BUN: 12 mg/dL (ref 6–23)
CALCIUM: 8.9 mg/dL (ref 8.4–10.5)
CHLORIDE: 106 meq/L (ref 96–112)
CO2: 25 meq/L (ref 19–32)
Creatinine, Ser: 1.09 mg/dL (ref 0.50–1.35)
GFR calc Af Amer: 85 mL/min — ABNORMAL LOW (ref >90)
GFR calc non Af Amer: 74 mL/min — ABNORMAL LOW (ref >90)
Glucose, Bld: 82 mg/dL (ref 70–99)
Potassium: 4.3 mEq/L (ref 3.7–5.3)
Sodium: 144 mEq/L (ref 137–147)

## 2013-06-20 NOTE — Progress Notes (Signed)
    Primary cardiologist: Dr. Everette RankJay Varanasi  Subjective:    No chest pain or shortness of breath.  Objective:   Temp:  [97.3 F (36.3 C)-97.7 F (36.5 C)] 97.7 F (36.5 C) (03/01 0503) Pulse Rate:  [61-84] 61 (03/01 0503) Resp:  [15-20] 15 (03/01 0503) BP: (105-127)/(72-85) 105/76 mmHg (03/01 0503) SpO2:  [96 %-100 %] 98 % (03/01 0503) Last BM Date: 06/18/13  Filed Weights   06/18/13 1647  Weight: 126 lb 1.7 oz (57.2 kg)    Intake/Output Summary (Last 24 hours) at 06/20/13 0847 Last data filed at 06/20/13 0000  Gross per 24 hour  Intake   2040 ml  Output   2500 ml  Net   -460 ml    Telemetry: Sinus rhythm.  Exam:  General: No distress.  Lungs: Clear, nonlabored.  Cardiac: RRR, no gallop.  Extremities: No pitting.   Lab Results:  Basic Metabolic Panel:  Recent Labs Lab 06/18/13 1139 06/20/13 0445  NA 133* 144  K 4.5 4.3  CL 97 106  CO2 25 25  GLUCOSE 87 82  BUN 11 12  CREATININE 1.18 1.09  CALCIUM 8.9 8.9    CBC:  Recent Labs Lab 06/18/13 1139 06/19/13 0117  WBC 6.3 3.3*  HGB 13.6 11.9*  HCT 39.8 35.4*  MCV 69.9* 69.8*  PLT 170 152    Cardiac Enzymes:  Recent Labs Lab 06/18/13 1438 06/18/13 1905 06/19/13 0117  TROPONINI <0.30 <0.30 <0.30    ECG: Sinus rhythm with incomplete right bundle branch block.  Echocardiogram (2/27): Study Conclusions  Left ventricle: The cavity size was normal. Wall thickness was normal. Systolic function was normal. The estimated ejection fraction was in the range of 50% to 55%. Wall motion was normal; there were no regional wall motion abnormalities. Left ventricular diastolic function parameters were normal.    Medications:   Scheduled Medications: . ARIPiprazole  5 mg Oral Daily  . aspirin EC  81 mg Oral Daily  . benztropine  1 mg Oral QHS  . enoxaparin (LOVENOX) injection  60 mg Subcutaneous Q12H  . LORazepam  0.5 mg Oral BID      PRN Medications:  acetaminophen, ALPRAZolam,  ondansetron (ZOFRAN) IV, zolpidem   Assessment:   1. Abnormal ECG, incomplete right bundle branch block, no clear evidence of STEMI, cardiac markers negative. He is not reporting any chest pain or shortness of breath. LVEF 50-55% by echocardiogram. I reviewed Dr. Alford HighlandMcLean's note. Not a classic Brugada pattern, but a consideration particularly with his history of schizophrenia as noted. No syncope or arrhythmias however.  2. Schizophrenia.  3. Hypotension, resolved. Recent systolic blood pressure 100-110 following fluid bolus.   Plan/Discussion:    No further cardiac workup planned as per Dr. Alford HighlandMcLean's note. Plan will be [facility]. Case manager involved. Should follow/establish with a primary care provider.   Jonelle SidleSamuel G. Gerrald Basu, M.D., F.A.C.C.

## 2013-06-20 NOTE — Discharge Summary (Signed)
Please see my rounding note. 

## 2013-06-20 NOTE — Discharge Summary (Signed)
CARDIOLOGY DISCHARGE SUMMARY    Patient ID: Terry Schroeder,  MRN: [id number], DOB/AGE: 57/12/1955 57 y.o.  Admit date: 06/18/2013 Discharge date: 06/20/2013  Primary Care Physician: [facility]; needs to establish with PCP Primary Cardiologist: [facility] to [facility] - Varanasi, MD  Primary Discharge Diagnosis:  1. Abnormal ECG "Patient has an incomplete RBBB on ECG with some mild difference from prior ECGs. It does not look like STEMI. His cardiac enzymes were negative and he has had no chest pain. Echo showed EF 50-55% with no major abnormalities. His ECG yesterday had a "saddle-back" type pattern in V2 (less so on today's ECG) that does raise concern for a Brugada pattern. It is interesting that a significant proportion of patients with schizophrenia do have a Brugada pattern on ECG (up to 10% or so on one report). I do not think that the significance of this finding is known. For now, do not think this will change treatment. He does not have a history of arrhythmia."  2. Hypotension, resolved with hydration  Secondary Discharge Diagnoses:  1. Schizophrenia  Procedures This Admission:   1. 2D echocardiogram Study Conclusions Left ventricle: The cavity size was normal. Wall thickness was normal. Systolic function was normal. The estimated ejection fraction was in the range of 50% to 55%. Wall motion was normal; there were no regional wall motion abnormalities. Left ventricular diastolic function parameters were normal.   History and Hospital Course:  [name] is a 58 year old man with schizophrenia who was sent to the ED with dizziness and was found to be mildly hypotensive. Initially, there was concern in the ED for STEMI but it was not. Per Dr. Alford HighlandMcLean's note, "Patient has an incomplete RBBB on ECG with some mild difference from prior ECGs. It does not look like STEMI. His cardiac enzymes were negative and he has had no chest pain. Echo showed EF 50-55% with no major  abnormalities. His ECG yesterday had a "saddle-back" type pattern in V2 (less so on today's ECG) that does raise concern for a Brugada pattern. It is interesting that a significant proportion of patients with schizophrenia do have a Brugada pattern on ECG (up to 10% or so on one report). I do not think that the significance of this finding is known. For now, do not think this will change treatment. He does not have a history of arrhythmia." While here he has not had any chest pain and his cardiac enzymes remain negative. He has not had any arrhythmias. He denied any history of syncope. An echo has been done revealing normal LV function, normal wall motion and no significant valvular abnormalities. He was given IV fluids with improved BP. Case management was consulted to assist with discharge planning. On 06/20/2013, he was seen, examined and deemed stable for [facility] by Dr. Diona BrownerMcDowell with recommendations to follow-up / establish care with PCP.  Discharge Vitals: Blood pressure 105/76, pulse 61, temperature 97.7 F (36.5 C), temperature source Oral, resp. rate 15, height 5\' 9"  (1.753 m), weight 126 lb 1.7 oz (57.2 kg), SpO2 98.00%.   Labs: Lab Results  Component Value Date   WBC 3.3* 06/19/2013   HGB 11.9* 06/19/2013   HCT 35.4* 06/19/2013   MCV 69.8* 06/19/2013   PLT 152 06/19/2013     Recent Labs Lab 06/20/13 0445  NA 144  K 4.3  CL 106  CO2 25  BUN 12  CREATININE 1.09  CALCIUM 8.9  GLUCOSE  82   Lab Results  Component Value Date   TROPONINI <0.30 06/19/2013    Lab Results  Component Value Date   CHOL 110 06/19/2013   Lab Results  Component Value Date   HDL 62 06/19/2013   Lab Results  Component Value Date   LDLCALC 40 06/19/2013   Lab Results  Component Value Date   TRIG 40 06/19/2013   Lab Results  Component Value Date   CHOLHDL 1.8 06/19/2013    Disposition:  The patient is being discharged in stable condition.  Follow-up:       Follow-up Information   Follow up with Primary Care (via [facility]). Schedule an appointment as soon as possible for a visit in 1 week. (For hospital follow-up and to establish care with PCP)      Discharge Medications:    Medication List         ARIPiprazole 5 MG tablet  Commonly known as:  ABILIFY  Take 5 mg by mouth daily.     benztropine 1 MG tablet  Commonly known as:  COGENTIN  Take 1 mg by mouth at bedtime.     LORazepam 0.5 MG tablet  Commonly known as:  ATIVAN  Take 0.5 mg by mouth 2 (two) times daily.       Duration of Discharge Encounter: Greater than 30 minutes including physician time.  Signed, Rick Duff, PA-C 06/20/2013, 9:36 AM

## 2013-06-22 DIAGNOSIS — F209 Schizophrenia, unspecified: Secondary | ICD-10-CM | POA: Diagnosis not present

## 2013-08-31 DIAGNOSIS — F2089 Other schizophrenia: Secondary | ICD-10-CM | POA: Diagnosis not present

## 2013-08-31 DIAGNOSIS — Z131 Encounter for screening for diabetes mellitus: Secondary | ICD-10-CM | POA: Diagnosis not present

## 2013-08-31 DIAGNOSIS — Z13220 Encounter for screening for lipoid disorders: Secondary | ICD-10-CM | POA: Diagnosis not present

## 2013-08-31 DIAGNOSIS — Z125 Encounter for screening for malignant neoplasm of prostate: Secondary | ICD-10-CM | POA: Diagnosis not present

## 2013-09-28 DIAGNOSIS — F209 Schizophrenia, unspecified: Secondary | ICD-10-CM | POA: Diagnosis not present

## 2013-11-24 DIAGNOSIS — F209 Schizophrenia, unspecified: Secondary | ICD-10-CM | POA: Diagnosis not present

## 2013-12-07 DIAGNOSIS — F2089 Other schizophrenia: Secondary | ICD-10-CM | POA: Diagnosis not present

## 2013-12-07 DIAGNOSIS — F411 Generalized anxiety disorder: Secondary | ICD-10-CM | POA: Diagnosis not present

## 2013-12-23 DIAGNOSIS — F209 Schizophrenia, unspecified: Secondary | ICD-10-CM | POA: Diagnosis not present

## 2014-03-23 DIAGNOSIS — F209 Schizophrenia, unspecified: Secondary | ICD-10-CM | POA: Diagnosis not present

## 2014-03-31 ENCOUNTER — Encounter (HOSPITAL_COMMUNITY): Payer: Self-pay | Admitting: Interventional Cardiology

## 2014-06-09 DIAGNOSIS — F419 Anxiety disorder, unspecified: Secondary | ICD-10-CM | POA: Diagnosis not present

## 2014-06-09 DIAGNOSIS — Z131 Encounter for screening for diabetes mellitus: Secondary | ICD-10-CM | POA: Diagnosis not present

## 2014-06-09 DIAGNOSIS — Z125 Encounter for screening for malignant neoplasm of prostate: Secondary | ICD-10-CM | POA: Diagnosis not present

## 2014-06-09 DIAGNOSIS — N3281 Overactive bladder: Secondary | ICD-10-CM | POA: Diagnosis not present

## 2014-06-09 DIAGNOSIS — Z13220 Encounter for screening for lipoid disorders: Secondary | ICD-10-CM | POA: Diagnosis not present

## 2014-06-16 DIAGNOSIS — F209 Schizophrenia, unspecified: Secondary | ICD-10-CM | POA: Diagnosis not present

## 2014-07-27 DIAGNOSIS — N3281 Overactive bladder: Secondary | ICD-10-CM | POA: Diagnosis not present

## 2014-07-27 DIAGNOSIS — F419 Anxiety disorder, unspecified: Secondary | ICD-10-CM | POA: Diagnosis not present

## 2014-07-27 DIAGNOSIS — F209 Schizophrenia, unspecified: Secondary | ICD-10-CM | POA: Diagnosis not present

## 2014-09-05 DIAGNOSIS — F209 Schizophrenia, unspecified: Secondary | ICD-10-CM | POA: Diagnosis not present

## 2014-12-05 DIAGNOSIS — F209 Schizophrenia, unspecified: Secondary | ICD-10-CM | POA: Diagnosis not present

## 2015-01-26 DIAGNOSIS — F209 Schizophrenia, unspecified: Secondary | ICD-10-CM | POA: Diagnosis not present

## 2015-01-26 DIAGNOSIS — N3281 Overactive bladder: Secondary | ICD-10-CM | POA: Diagnosis not present

## 2015-02-08 DIAGNOSIS — Z23 Encounter for immunization: Secondary | ICD-10-CM | POA: Diagnosis not present

## 2015-03-07 DIAGNOSIS — F209 Schizophrenia, unspecified: Secondary | ICD-10-CM | POA: Diagnosis not present

## 2015-06-05 DIAGNOSIS — F209 Schizophrenia, unspecified: Secondary | ICD-10-CM | POA: Diagnosis not present

## 2015-10-19 DIAGNOSIS — F209 Schizophrenia, unspecified: Secondary | ICD-10-CM | POA: Diagnosis not present

## 2015-12-14 ENCOUNTER — Encounter: Payer: Self-pay | Admitting: Cardiology

## 2016-01-01 DIAGNOSIS — F209 Schizophrenia, unspecified: Secondary | ICD-10-CM | POA: Diagnosis not present

## 2016-03-04 DIAGNOSIS — F209 Schizophrenia, unspecified: Secondary | ICD-10-CM | POA: Diagnosis not present

## 2016-05-31 DIAGNOSIS — F209 Schizophrenia, unspecified: Secondary | ICD-10-CM | POA: Diagnosis not present

## 2016-06-13 DIAGNOSIS — F209 Schizophrenia, unspecified: Secondary | ICD-10-CM | POA: Diagnosis not present

## 2016-09-24 DIAGNOSIS — F209 Schizophrenia, unspecified: Secondary | ICD-10-CM | POA: Diagnosis not present

## 2016-11-14 DIAGNOSIS — Z0000 Encounter for general adult medical examination without abnormal findings: Secondary | ICD-10-CM | POA: Diagnosis not present

## 2016-11-14 DIAGNOSIS — Z13220 Encounter for screening for lipoid disorders: Secondary | ICD-10-CM | POA: Diagnosis not present

## 2016-11-14 DIAGNOSIS — H6121 Impacted cerumen, right ear: Secondary | ICD-10-CM | POA: Diagnosis not present

## 2016-11-14 DIAGNOSIS — F209 Schizophrenia, unspecified: Secondary | ICD-10-CM | POA: Diagnosis not present

## 2016-11-14 DIAGNOSIS — Z125 Encounter for screening for malignant neoplasm of prostate: Secondary | ICD-10-CM | POA: Diagnosis not present

## 2016-11-14 DIAGNOSIS — Z131 Encounter for screening for diabetes mellitus: Secondary | ICD-10-CM | POA: Diagnosis not present

## 2016-12-26 DIAGNOSIS — H6122 Impacted cerumen, left ear: Secondary | ICD-10-CM | POA: Diagnosis not present

## 2016-12-26 DIAGNOSIS — F209 Schizophrenia, unspecified: Secondary | ICD-10-CM | POA: Diagnosis not present

## 2017-03-24 DIAGNOSIS — F209 Schizophrenia, unspecified: Secondary | ICD-10-CM | POA: Diagnosis not present

## 2017-04-30 DIAGNOSIS — F209 Schizophrenia, unspecified: Secondary | ICD-10-CM | POA: Diagnosis not present

## 2017-04-30 DIAGNOSIS — H1033 Unspecified acute conjunctivitis, bilateral: Secondary | ICD-10-CM | POA: Diagnosis not present

## 2017-04-30 DIAGNOSIS — J019 Acute sinusitis, unspecified: Secondary | ICD-10-CM | POA: Diagnosis not present

## 2017-05-13 ENCOUNTER — Emergency Department (HOSPITAL_COMMUNITY): Payer: Medicare Other

## 2017-05-13 ENCOUNTER — Other Ambulatory Visit: Payer: Self-pay

## 2017-05-13 ENCOUNTER — Inpatient Hospital Stay (HOSPITAL_COMMUNITY)
Admission: EM | Admit: 2017-05-13 | Discharge: 2017-05-15 | DRG: 917 | Disposition: A | Discharge status: Home / Self Care | Payer: Medicare Other | Attending: Internal Medicine | Admitting: Internal Medicine

## 2017-05-13 ENCOUNTER — Encounter (HOSPITAL_COMMUNITY): Payer: Self-pay | Admitting: Emergency Medicine

## 2017-05-13 DIAGNOSIS — F209 Schizophrenia, unspecified: Secondary | ICD-10-CM | POA: Diagnosis present

## 2017-05-13 DIAGNOSIS — G9341 Metabolic encephalopathy: Secondary | ICD-10-CM | POA: Diagnosis present

## 2017-05-13 DIAGNOSIS — F79 Unspecified intellectual disabilities: Secondary | ICD-10-CM | POA: Diagnosis not present

## 2017-05-13 DIAGNOSIS — R400 Somnolence: Secondary | ICD-10-CM | POA: Diagnosis not present

## 2017-05-13 DIAGNOSIS — N182 Chronic kidney disease, stage 2 (mild): Secondary | ICD-10-CM | POA: Diagnosis present

## 2017-05-13 DIAGNOSIS — T443X1A Poisoning by other parasympatholytics [anticholinergics and antimuscarinics] and spasmolytics, accidental (unintentional), initial encounter: Secondary | ICD-10-CM | POA: Diagnosis present

## 2017-05-13 DIAGNOSIS — R4182 Altered mental status, unspecified: Secondary | ICD-10-CM | POA: Diagnosis not present

## 2017-05-13 DIAGNOSIS — T424X1A Poisoning by benzodiazepines, accidental (unintentional), initial encounter: Secondary | ICD-10-CM | POA: Diagnosis not present

## 2017-05-13 DIAGNOSIS — G92 Toxic encephalopathy: Secondary | ICD-10-CM | POA: Diagnosis present

## 2017-05-13 DIAGNOSIS — N390 Urinary tract infection, site not specified: Secondary | ICD-10-CM | POA: Diagnosis not present

## 2017-05-13 DIAGNOSIS — D509 Iron deficiency anemia, unspecified: Secondary | ICD-10-CM | POA: Diagnosis present

## 2017-05-13 DIAGNOSIS — R8271 Bacteriuria: Secondary | ICD-10-CM | POA: Diagnosis present

## 2017-05-13 DIAGNOSIS — R404 Transient alteration of awareness: Secondary | ICD-10-CM | POA: Diagnosis not present

## 2017-05-13 DIAGNOSIS — R42 Dizziness and giddiness: Secondary | ICD-10-CM | POA: Diagnosis not present

## 2017-05-13 DIAGNOSIS — N3000 Acute cystitis without hematuria: Secondary | ICD-10-CM | POA: Diagnosis present

## 2017-05-13 LAB — CBC WITH DIFFERENTIAL/PLATELET
BASOS ABS: 0 10*3/uL (ref 0.0–0.1)
BASOS PCT: 0 %
Basophils Absolute: 0 10*3/uL (ref 0.0–0.1)
Basophils Relative: 0 %
Eosinophils Absolute: 0.1 10*3/uL (ref 0.0–0.7)
Eosinophils Absolute: 0.1 10*3/uL (ref 0.0–0.7)
Eosinophils Relative: 1 %
Eosinophils Relative: 1 %
HCT: 37.8 % — ABNORMAL LOW (ref 39.0–52.0)
HEMATOCRIT: 37.5 % — AB (ref 39.0–52.0)
HEMOGLOBIN: 12.2 g/dL — AB (ref 13.0–17.0)
Hemoglobin: 12.2 g/dL — ABNORMAL LOW (ref 13.0–17.0)
Lymphocytes Relative: 22 %
Lymphocytes Relative: 19 %
Lymphs Abs: 1.7 10*3/uL (ref 0.7–4.0)
Lymphs Abs: 2 10*3/uL (ref 0.7–4.0)
MCH: 23 pg — ABNORMAL LOW (ref 26.0–34.0)
MCH: 23.1 pg — ABNORMAL LOW (ref 26.0–34.0)
MCHC: 32.3 g/dL (ref 30.0–36.0)
MCHC: 32.5 g/dL (ref 30.0–36.0)
MCV: 71.3 fL — ABNORMAL LOW (ref 78.0–100.0)
MCV: 70.9 fL — ABNORMAL LOW (ref 78.0–100.0)
MONOS PCT: 8 %
Monocytes Absolute: 0.6 10*3/uL (ref 0.1–1.0)
Monocytes Absolute: 0.8 10*3/uL (ref 0.1–1.0)
Monocytes Relative: 8 %
NEUTROS ABS: 7.7 10*3/uL (ref 1.7–7.7)
NEUTROS PCT: 72 %
Neutro Abs: 5.4 10*3/uL (ref 1.7–7.7)
Neutrophils Relative %: 69 %
Platelets: 223 10*3/uL (ref 150–400)
Platelets: 209 10*3/uL (ref 150–400)
RBC: 5.3 MIL/uL (ref 4.22–5.81)
RBC: 5.29 MIL/uL (ref 4.22–5.81)
RDW: 13.9 % (ref 11.5–15.5)
RDW: 13.9 % (ref 11.5–15.5)
WBC: 7.8 10*3/uL (ref 4.0–10.5)
WBC: 10.6 10*3/uL — ABNORMAL HIGH (ref 4.0–10.5)

## 2017-05-13 LAB — I-STAT ARTERIAL BLOOD GAS, ED
Acid-base deficit: 1 mmol/L (ref 0.0–2.0)
Bicarbonate: 23 mmol/L (ref 20.0–28.0)
O2 Saturation: 97 %
Patient temperature: 98.7
TCO2: 24 mmol/L (ref 22–32)
pCO2 arterial: 36.2 mmHg (ref 32.0–48.0)
pH, Arterial: 7.41 (ref 7.350–7.450)
pO2, Arterial: 85 mmHg (ref 83.0–108.0)

## 2017-05-13 LAB — COMPREHENSIVE METABOLIC PANEL
ALT: 39 U/L (ref 17–63)
AST: 29 U/L (ref 15–41)
Albumin: 3.4 g/dL — ABNORMAL LOW (ref 3.5–5.0)
Alkaline Phosphatase: 81 U/L (ref 38–126)
Anion gap: 12 (ref 5–15)
BUN: 9 mg/dL (ref 6–20)
CO2: 22 mmol/L (ref 22–32)
Calcium: 8.9 mg/dL (ref 8.9–10.3)
Chloride: 104 mmol/L (ref 101–111)
Creatinine, Ser: 1.18 mg/dL (ref 0.61–1.24)
GFR calc Af Amer: >60 mL/min (ref >60)
GFR calc non Af Amer: >60 mL/min (ref >60)
Glucose, Bld: 91 mg/dL (ref 65–99)
Potassium: 3.9 mmol/L (ref 3.5–5.1)
Sodium: 138 mmol/L (ref 135–145)
Total Bilirubin: 0.5 mg/dL (ref 0.3–1.2)
Total Protein: 7.1 g/dL (ref 6.5–8.1)

## 2017-05-13 LAB — CREATININE, SERUM
Creatinine, Ser: 1.25 mg/dL — ABNORMAL HIGH (ref 0.61–1.24)
GFR calc Af Amer: >60 mL/min (ref >60)

## 2017-05-13 LAB — URINALYSIS, ROUTINE W REFLEX MICROSCOPIC
Bilirubin Urine: NEGATIVE
Glucose, UA: NEGATIVE mg/dL
Ketones, ur: NEGATIVE mg/dL
Nitrite: NEGATIVE
Protein, ur: NEGATIVE mg/dL
Specific Gravity, Urine: 1.008 (ref 1.005–1.030)
Squamous Epithelial / LPF: NONE SEEN
pH: 5 (ref 5.0–8.0)

## 2017-05-13 LAB — ETHANOL: Alcohol, Ethyl (B): <10 mg/dL (ref <10)

## 2017-05-13 LAB — RAPID URINE DRUG SCREEN, HOSP PERFORMED
Amphetamines: NOT DETECTED
Barbiturates: NOT DETECTED
Benzodiazepines: NOT DETECTED
Cocaine: NOT DETECTED
Opiates: NOT DETECTED
Tetrahydrocannabinol: NOT DETECTED

## 2017-05-13 LAB — ACETAMINOPHEN LEVEL: Acetaminophen (Tylenol), Serum: <10 ug/mL — ABNORMAL LOW (ref 10–30)

## 2017-05-13 LAB — SALICYLATE LEVEL: Salicylate Lvl: <7 mg/dL (ref 2.8–30.0)

## 2017-05-13 LAB — AMMONIA: Ammonia: 44 umol/L — ABNORMAL HIGH (ref 9–35)

## 2017-05-13 LAB — I-STAT CG4 LACTIC ACID, ED
Lactic Acid, Venous: 1.34 mmol/L (ref 0.5–1.9)
Lactic Acid, Venous: 1.44 mmol/L (ref 0.5–1.9)

## 2017-05-13 LAB — CBG MONITORING, ED: GLUCOSE-CAPILLARY: 104 mg/dL — AB (ref 65–99)

## 2017-05-13 MED ORDER — DEXTROSE 5 % IV SOLN
1.0000 g | INTRAVENOUS | Status: DC
  Administered 2017-05-14: 1 g via INTRAVENOUS
  Filled 2017-05-13: qty 10

## 2017-05-13 MED ORDER — NALOXONE HCL 0.4 MG/ML IJ SOLN
INTRAMUSCULAR | Status: AC
  Filled 2017-05-13: qty 1

## 2017-05-13 MED ORDER — LORAZEPAM 0.5 MG PO TABS
0.5000 mg | ORAL_TABLET | Freq: Two times a day (BID) | ORAL | Status: DC
  Administered 2017-05-13 – 2017-05-15 (×4): 0.5 mg via ORAL
  Filled 2017-05-13 (×4): qty 1

## 2017-05-13 MED ORDER — DEXTROSE 5 % IV SOLN
1.0000 g | Freq: Once | INTRAVENOUS | Status: AC
  Administered 2017-05-13: 1 g via INTRAVENOUS
  Filled 2017-05-13: qty 10

## 2017-05-13 MED ORDER — CEFTRIAXONE SODIUM 1 G IJ SOLR: 1.0000 g | INTRAMUSCULAR | Status: DC

## 2017-05-13 MED ORDER — ENOXAPARIN SODIUM 40 MG/0.4ML ~~LOC~~ SOLN
40.0000 mg | SUBCUTANEOUS | Status: DC
  Administered 2017-05-14 – 2017-05-15 (×2): 40 mg via SUBCUTANEOUS
  Filled 2017-05-13 (×2): qty 0.4

## 2017-05-13 MED ORDER — SODIUM CHLORIDE 0.9 % IV BOLUS (SEPSIS)
1000.0000 mL | Freq: Once | INTRAVENOUS | Status: AC
  Administered 2017-05-13: 1000 mL via INTRAVENOUS

## 2017-05-13 MED ORDER — SODIUM CHLORIDE 0.9 % IV SOLN
INTRAVENOUS | Status: DC
  Administered 2017-05-13 – 2017-05-14 (×2): via INTRAVENOUS

## 2017-05-13 MED ORDER — BENZTROPINE MESYLATE 1 MG PO TABS
1.0000 mg | ORAL_TABLET | Freq: Every day | ORAL | Status: DC
  Administered 2017-05-13 – 2017-05-14 (×2): 1 mg via ORAL
  Filled 2017-05-13 (×2): qty 1

## 2017-05-13 MED ORDER — ARIPIPRAZOLE 5 MG PO TABS
5.0000 mg | ORAL_TABLET | Freq: Every day | ORAL | Status: DC
  Administered 2017-05-14 – 2017-05-15 (×2): 5 mg via ORAL
  Filled 2017-05-13 (×2): qty 1

## 2017-05-13 NOTE — H&P (Signed)
History and Physical    Terry Schroeder NWG:[id number] DOB: [birth year] DOA: 05/13/2017  Referring MD/NP/PA: Harolyn Rutherford, PA  PCP: System, Pcp Not In   Outpatient Specialists: None   Patient coming from: Group home  Chief Complaint: Altered mental status  HPI: Terry Schroeder is a 62 y.o. male with medical history significant of mental retardation  who was found by EMS at an adult daycare facility completely obtunded but maintaining his vital signs. He had pinpoint pupils but no evidence of drug abuse. He lives in a group home where they monitored the patient and also administer his medications. No evidence of IV drug abuse and no evidence of any other street drugs. There was reported is that [facility] staff at the group home was fired yesterday due to medication related issues. Patient is now more awake. On arrival he was not able to communicate but even now he is not able  to give current history due to mental retardation. History his workup so far only showed evidence of mild UTI.  ED Course: Patient was evaluated in the ER. Mental status has improved. 6 head CT without contrast is negative. Urine drug screen also negative. Urinalysis showed evidence of small leukocyte esterase with bacteria and wbc's 6-30.  Review of Systems: As per HPI otherwise 10 point review of systems negative.    Past Medical History:  Diagnosis Date  . Mentally challenged   . Nonspecific abnormal electrocardiogram (ECG) (EKG)   . Schizophrenia (HCC)   . Shortness of breath     Past Surgical History:  Procedure Laterality Date  . LEFT HEART CATHETERIZATION WITH CORONARY ANGIOGRAM N/A 06/18/2013   Procedure: LEFT HEART CATHETERIZATION WITH CORONARY ANGIOGRAM;  Surgeon: Corky Crafts, MD;  Location: [facility] CATH LAB;  Service: Cardiovascular;  Laterality: N/A;     reports that  has never smoked. he has never used smokeless tobacco. He reports that he does not drink alcohol or use drugs.  No Known  Allergies  No family history on file.   Prior to Admission medications   Medication Sig Start Date End Date Taking? Authorizing Provider  ARIPiprazole (ABILIFY) 5 MG tablet Take 5 mg by mouth daily.   Yes [provider]  benztropine (COGENTIN) 1 MG tablet Take 1 mg by mouth at bedtime.    Yes [provider]  LORazepam (ATIVAN) 0.5 MG tablet Take 0.5 mg by mouth 2 (two) times daily.   Yes [provider]    Physical Exam: Vitals:   05/13/17 1730 05/13/17 1800 05/13/17 1803 05/13/17 1830  BP: 101/68 (!) 114/53 (!) 114/53 98/61  Pulse: 69 (!) 58 (!) 54 74  Resp: 14 19 19 16   Temp:      TempSrc:      SpO2: 97% 98% 93% 96%  Weight:          Constitutional: NAD, calm, comfortable Vitals:   05/13/17 1730 05/13/17 1800 05/13/17 1803 05/13/17 1830  BP: 101/68 (!) 114/53 (!) 114/53 98/61  Pulse: 69 (!) 58 (!) 54 74  Resp: 14 19 19 16   Temp:      TempSrc:      SpO2: 97% 98% 93% 96%  Weight:       Eyes: PERRL, lids and conjunctivae normal ENMT: Mucous membranes are moist. Posterior pharynx clear of any exudate or lesions.Normal dentition.  Neck: normal, supple, no masses, no thyromegaly Respiratory: clear to auscultation bilaterally, no wheezing, no crackles. Normal respiratory effort. No accessory muscle use.  Cardiovascular: Regular rate  and rhythm, no murmurs / rubs / gallops. No extremity edema. 2+ pedal pulses. No carotid bruits.  Abdomen: no tenderness, no masses palpated. No hepatosplenomegaly. Bowel sounds positive.  Musculoskeletal: no clubbing / cyanosis. No joint deformity upper and lower extremities. Good ROM, no contractures. Normal muscle tone.  Skin: no rashes, lesions, ulcers. No induration Neurologic: CN 2-12 grossly intact. Sensation intact, DTR normal. Strength 5/5 in all 4.  Psychiatric: Normal judgment and insight. Alert and oriented x 3. Normal mood.   (Anything < 9 systems with 2 bullets each down codes to level 1) (If patient  refuses exam can't bill higher level) (Make sure to document decubitus ulcers present on admission -- if possible -- and whether patient has chronic indwelling catheter at time of admission)  Labs on Admission: I have personally reviewed following labs and imaging studies  CBC: Recent Labs  Lab 05/13/17 1324  WBC 7.8  NEUTROABS 5.4  HGB 12.2*  HCT 37.8*  MCV 71.3*  PLT 223   Basic Metabolic Panel: Recent Labs  Lab 05/13/17 1324  NA 138  K 3.9  CL 104  CO2 22  GLUCOSE 91  BUN 9  CREATININE 1.18  CALCIUM 8.9   GFR: CrCl cannot be calculated (Unknown ideal weight.). Liver Function Tests: Recent Labs  Lab 05/13/17 1324  AST 29  ALT 39  ALKPHOS 81  BILITOT 0.5  PROT 7.1  ALBUMIN 3.4*   No results for input(s): LIPASE, AMYLASE in the last 168 hours. Recent Labs  Lab 05/13/17 1517  AMMONIA 44*   Coagulation Profile: No results for input(s): INR, PROTIME in the last 168 hours. Cardiac Enzymes: No results for input(s): CKTOTAL, CKMB, CKMBINDEX, TROPONINI in the last 168 hours. BNP (last 3 results) No results for input(s): PROBNP in the last 8760 hours. HbA1C: No results for input(s): HGBA1C in the last 72 hours. CBG: Recent Labs  Lab 05/13/17 1316  GLUCAP 104*   Lipid Profile: No results for input(s): CHOL, HDL, LDLCALC, TRIG, CHOLHDL, LDLDIRECT in the last 72 hours. Thyroid Function Tests: No results for input(s): TSH, T4TOTAL, FREET4, T3FREE, THYROIDAB in the last 72 hours. Anemia Panel: No results for input(s): VITAMINB12, FOLATE, FERRITIN, TIBC, IRON, RETICCTPCT in the last 72 hours. Urine analysis:    Component Value Date/Time   COLORURINE YELLOW 05/13/2017 1551   APPEARANCEUR CLEAR 05/13/2017 1551   LABSPEC 1.008 05/13/2017 1551   PHURINE 5.0 05/13/2017 1551   GLUCOSEU NEGATIVE 05/13/2017 1551   HGBUR SMALL (A) 05/13/2017 1551   BILIRUBINUR NEGATIVE 05/13/2017 1551   KETONESUR NEGATIVE 05/13/2017 1551   PROTEINUR NEGATIVE 05/13/2017 1551    NITRITE NEGATIVE 05/13/2017 1551   LEUKOCYTESUR SMALL (A) 05/13/2017 1551   Sepsis Labs: @LABRCNTIP (procalcitonin:4,lacticidven:4) )No results found for this or any previous visit (from the past 240 hour(s)).   Radiological Exams on Admission: Ct Head Wo Contrast  Result Date: 05/13/2017 CLINICAL DATA:  Altered mental status. EXAM: CT HEAD WITHOUT CONTRAST TECHNIQUE: Contiguous axial images were obtained from the base of the skull through the vertex without intravenous contrast. COMPARISON:  None FINDINGS: Brain: No evidence of acute infarction, hemorrhage, hydrocephalus, extra-axial collection or mass lesion/mass effect. Vascular: No hyperdense vessel or unexpected calcification. Skull: Normal. Negative for fracture or focal lesion. Sinuses/Orbits: No acute finding. Other: None. IMPRESSION: 1. No acute intracranial abnormalities. Electronically Signed   By: Signa Kellaylor  Stroud M.D.   On: 05/13/2017 15:17   Dg Chest Portable 1 View  Result Date: 05/13/2017 CLINICAL DATA:  Altered mental status EXAM: PORTABLE  CHEST 1 VIEW COMPARISON:  June 18, 2013 FINDINGS: There is no edema or consolidation. Heart is upper normal in size with pulmonary vascularity within normal limits. No adenopathy. No pneumothorax. No bone lesions. IMPRESSION: No edema or consolidation. Electronically Signed   By: Bretta Bang III M.D.   On: 05/13/2017 18:08    EKG: Independently reviewed.   Assessment/Plan Principal Problem:   Metabolic encephalopathy Active Problems:   Schizophrenia (HCC)   UTI (urinary tract infection)    #1 metabolic Encephalopathy: Patient appears to have had transient amnesia probably medication related but also UTI. He is not febrile and no significant white count so I doubt this is purely UTI. It could be some medication that is not mentioned in the tox screen was drawn from his typical medications. He will be observed overnight and monitored. He appears to be close to his baseline in the  moment. If patient improves he may be returned to his group home.  #2 UTI: Urine culture and blood cultures have been sent. We'll monitor results and adjust in a vertex. Start empiric IV Rocephin 1 g daily  #3 history of schizophrenia: Continue home regimen of medications.  #4 reported mental rotation: Patient is close to his baseline in the moment.   DVT prophylaxis: Lovenox   Code Status: full   Family Communication: None available   Disposition Plan: Back to group home   Consults called: None   Admission status: Observation   Severity of Illness: The appropriate patient status for this patient is OBSERVATION. Observation status is judged to be reasonable and necessary in order to provide the required intensity of service to ensure the patient's safety. The patient's presenting symptoms, physical exam findings, and initial radiographic and laboratory data in the context of their medical condition is felt to place them at decreased risk for further clinical deterioration. Furthermore, it is anticipated that the patient will be medically stable for discharge from the hospital within 2 midnights of admission. The following factors support the patient status of observation.   " The patient's presenting symptoms include confusion. " The physical exam findings include confused patient. " The initial radiographic and laboratory data are evidence of UTI.     Lonia Blood MD [facility] Pager [phone]  If 7PM-7AM, please contact night-coverage [email or web address] Password [facility]  05/13/2017, 7:08 PM

## 2017-05-13 NOTE — ED Triage Notes (Signed)
Pt in from adult daycare via [facility] with AMS, per staff just "not acting normal self". Hx of MR, baseline schizo, and minimally able to answer ?'s, follow commands. EMS states pt was able to walk to stretcher, arrives snoring with pinpoint pupils. Sats 100% RA, CBG 129. No narcan administered PTA

## 2017-05-13 NOTE — ED Provider Notes (Deleted)
[facility] Provider Note   CSN: [id number] Arrival date & time: 05/13/17  1312     History   Chief Complaint Chief Complaint  Patient presents with  . Altered Mental Status    HPI Terry Schroeder is a 62 y.o. male.  HPI Patient presents to the emergency department with unresponsiveness.  EMS states that they found the patient had a adult daycare facility unresponsive but breathing and maintaining vital signs within normal limits.  They did find that his pupils were pinpoint.  The patient states that her group home and they may administer his medications.  Apparently there was some incident last night at the group home where someone who was given at the medications was fired in the middle of his shift.  The patient is unable to answer questions at this time he is unresponsive he will respond to pain.  There was some possibility that the staff at the adult daycare mention that he may have lost consciousness several times for EMS was called Past Medical History:  Diagnosis Date  . Mentally challenged   . Nonspecific abnormal electrocardiogram (ECG) (EKG)   . Schizophrenia (HCC)   . Shortness of breath     Patient Active Problem List   Diagnosis Date Noted  . Angina at rest [facility](HCC) 06/18/2013  . Precordial pain 06/18/2013  . Shortness of breath   . Nonspecific abnormal electrocardiogram (ECG) (EKG)   . Mentally challenged   . Schizophrenia [facility](HCC)     Past Surgical History:  Procedure Laterality Date  . LEFT HEART CATHETERIZATION WITH CORONARY ANGIOGRAM N/A 06/18/2013   Procedure: LEFT HEART CATHETERIZATION WITH CORONARY ANGIOGRAM;  Surgeon: Corky CraftsJayadeep S Varanasi, MD;  [facility];  Service: Cardiovascular;  Laterality: N/A;       Home Medications    Prior to Admission medications   Medication Sig Start Date End Date Taking? Authorizing Provider  ARIPiprazole (ABILIFY) 5 MG tablet Take 5 mg by mouth daily.   Yes [provider]  benztropine (COGENTIN) 1 MG tablet Take 1 mg by mouth at bedtime.    Yes [provider]  LORazepam (ATIVAN) 0.5 MG tablet Take 0.5 mg by mouth 2 (two) times daily.   Yes [provider]    Family History No family history on file.  Social History Social History   Tobacco Use  . Smoking status: Never Smoker  . Smokeless tobacco: Never Used  Substance Use Topics  . Alcohol use: No  . Drug use: No     Allergies   Patient has no known allergies.   Review of Systems Review of Systems level 5 caveat applies due to unresponsiveness Physical Exam Updated Vital Signs BP 110/67   Pulse 62   Temp 98.7 F (37.1 C) (Rectal)   Resp 11   Wt 57.2 kg (126 lb)   SpO2 99%   BMI 18.61 kg/m   Physical Exam  Constitutional: He appears well-developed and well-nourished. No distress.  HENT:  Head: Normocephalic and atraumatic.  Mouth/Throat: Oropharynx is clear and moist.  Eyes:  Patient has pinpoint pupils bilaterally that are not reactive.  Neck: Normal range of motion. Neck supple.  Cardiovascular: Normal rate, regular rhythm and normal heart sounds. Exam reveals no gallop and no friction rub.  No murmur heard. Pulmonary/Chest: Effort normal and breath sounds normal. No respiratory distress. He has no wheezes.  Abdominal: Soft. Bowel sounds are normal. He exhibits no distension. There is no tenderness.  Neurological:  He is unresponsive. He exhibits normal muscle tone. Coordination normal.  Patient has good corneal reflexes  Skin: Skin is warm and dry. Capillary refill takes less than 2 seconds. No rash noted. No erythema.  Psychiatric: He has a normal mood and affect. His behavior is normal.  Nursing note and vitals reviewed.    ED Treatments / Results  Labs (all labs ordered are listed, but only abnormal results are displayed) Labs Reviewed  COMPREHENSIVE METABOLIC PANEL - Abnormal; Notable for the following components:      Result Value     Albumin 3.4 (*)    All other components within normal limits  CBC WITH DIFFERENTIAL/PLATELET - Abnormal; Notable for the following components:   Hemoglobin 12.2 (*)    HCT 37.8 (*)    MCV 71.3 (*)    MCH 23.0 (*)    All other components within normal limits  ACETAMINOPHEN LEVEL - Abnormal; Notable for the following components:   Acetaminophen (Tylenol), Serum <10 (*)    All other components within normal limits  CBG MONITORING, ED - Abnormal; Notable for the following components:   Glucose-Capillary 104 (*)    All other components within normal limits  ETHANOL  SALICYLATE LEVEL  RAPID URINE DRUG SCREEN, HOSP PERFORMED  URINALYSIS, ROUTINE W REFLEX MICROSCOPIC  AMMONIA  I-STAT CG4 LACTIC ACID, ED  I-STAT CG4 LACTIC ACID, ED  I-STAT ARTERIAL BLOOD GAS, ED    EKG  EKG Interpretation None       Radiology Ct Head Wo Contrast  Result Date: 05/13/2017 CLINICAL DATA:  Altered mental status. EXAM: CT HEAD WITHOUT CONTRAST TECHNIQUE: Contiguous axial images were obtained from the base of the skull through the vertex without intravenous contrast. COMPARISON:  None FINDINGS: Brain: No evidence of acute infarction, hemorrhage, hydrocephalus, extra-axial collection or mass lesion/mass effect. Vascular: No hyperdense vessel or unexpected calcification. Skull: Normal. Negative for fracture or focal lesion. Sinuses/Orbits: No acute finding. Other: None. IMPRESSION: 1. No acute intracranial abnormalities. Electronically Signed   By: Signa Kell M.D.   On: 05/13/2017 15:17    Procedures Procedures (including critical care time)  Medications Ordered in ED Medications  naloxone (NARCAN) 0.4 MG/ML injection (not administered)     Initial Impression / Assessment and Plan / ED Course  I have reviewed the triage vital signs and the nursing notes.  Pertinent labs & imaging results that were available during my care of the patient were reviewed by me and considered in my medical decision  making (see chart for details).     I reexamined the patient and he will arouse to voice and follows some commands.  He will squeeze my hands and raise his legs.  Patient will need further monitoring as I feel that this is some sort of medication related issue.  Based on the fact that he is now becoming more alert and responsive he did not respond to Narcan initially.  Final Clinical Impressions(s) / ED Diagnoses   Final diagnoses:  None    ED Discharge Orders    None       Charlestine Night, [place] 05/13/17 1642

## 2017-05-13 NOTE — ED Notes (Signed)
Patient transported to CT 

## 2017-05-13 NOTE — ED Notes (Signed)
Condom catheter applied.

## 2017-05-13 NOTE — ED Provider Notes (Addendum)
Terry Schroeder is a 62 y.o. male, with a history of MR and schizophrenia, presenting to the ED with altered mental status.     Patient presents to the emergency department with unresponsiveness.  EMS states that they found the patient had a adult daycare facility unresponsive but breathing and maintaining vital signs within normal limits.  They did find that his pupils were pinpoint.  The patient states that her group home and they may administer his medications.  Apparently there was some incident last night at the group home where someone who was given at the medications was fired in the middle of his shift.  The patient is unable to answer questions at this time he is unresponsive he will respond to pain.  There was some possibility that the staff at the adult daycare mention that he may have lost consciousness several times for EMS was called.  Past Medical History:  Diagnosis Date  . Mentally challenged   . Nonspecific abnormal electrocardiogram (ECG) (EKG)   . Schizophrenia (HCC)   . Shortness of breath     Physical Exam  BP 110/67   Pulse 62   Temp 98.7 F (37.1 Schroeder) (Rectal)   Resp 11   Wt 57.2 kg (126 lb)   SpO2 99%   BMI 18.61 kg/m   Physical Exam  Constitutional: He appears well-developed and well-nourished. He does not appear ill. No distress.  HENT:  Head: Normocephalic and atraumatic.  Mouth/Throat: Oropharynx is clear and moist.  Eyes: Conjunctivae and EOM are normal. Right pupil is not reactive. Left pupil is not reactive.    Neck: Neck supple.  Cardiovascular: Normal rate, regular rhythm, normal heart sounds and intact distal pulses.  Pulmonary/Chest: Effort normal and breath sounds normal. No respiratory distress.  Abdominal: Soft. There is no tenderness. There is no guarding.  Musculoskeletal: He exhibits no edema.  Lymphadenopathy:    He has no cervical adenopathy.  Neurological: He is unresponsive.  Skin: Skin is warm and dry. Capillary refill takes less  than 2 seconds. He is not diaphoretic.  Psychiatric: He has a normal mood and affect. His behavior is normal.  Nursing note and vitals reviewed.   ED Course/Procedures   Clinical Course as of May 14 1903  Tue May 13, 2017  1725 Spoke with [name], patient's brother at the bedside. [phone] cell.   [SJ]  1737 Spoke with Terry Schroeder, group home representative. States patient does not have access to any medications to where he could have taken too many or taken someone else's medications. However, when she checked the patient's medication administration record, it was noted he was reportedly given four of his cogentin and two of his Lorazepam around 8pm last night.   [SJ]  [id number] Spoke with Dr. Mikeal Hawthorne, hospitalist. Agrees to admit the patient.  [SJ]  1849 Spoke with Patty from [employer].  CNS depression would be expected. Observe for EKG changes, especially increased intervals.  Once symptoms resolve and he can ambulate and mentate normally, he can be discharged.   [SJ]    Clinical Course User Index [SJ] Terry, [name], PA-Schroeder    Procedures   Labs Reviewed  COMPREHENSIVE METABOLIC PANEL - Abnormal; Notable for the following components:      Result Value   Albumin 3.4 (*)    All other components within normal limits  CBC WITH DIFFERENTIAL/PLATELET - Abnormal; Notable for the following components:   Hemoglobin 12.2 (*)    HCT 37.8 (*)  MCV 71.3 (*)    MCH 23.0 (*)    All other components within normal limits  URINALYSIS, ROUTINE W REFLEX MICROSCOPIC - Abnormal; Notable for the following components:   Hgb urine dipstick SMALL (*)    Leukocytes, UA SMALL (*)    Bacteria, UA MANY (*)    All other components within normal limits  ACETAMINOPHEN LEVEL - Abnormal; Notable for the following components:   Acetaminophen (Tylenol), Serum <10 (*)    All other components within normal limits  AMMONIA - Abnormal; Notable for the following components:   Ammonia 44 (*)    All other  components within normal limits  CBG MONITORING, ED - Abnormal; Notable for the following components:   Glucose-Capillary 104 (*)    All other components within normal limits  URINE CULTURE  ETHANOL  SALICYLATE LEVEL  RAPID URINE DRUG SCREEN, HOSP PERFORMED  I-STAT CG4 LACTIC ACID, ED  I-STAT CG4 LACTIC ACID, ED  I-STAT ARTERIAL BLOOD GAS, ED   Ct Head Wo Contrast  Result Date: 05/13/2017 CLINICAL DATA:  Altered mental status. EXAM: CT HEAD WITHOUT CONTRAST TECHNIQUE: Contiguous axial images were obtained from the base of the skull through the vertex without intravenous contrast. COMPARISON:  None FINDINGS: Brain: No evidence of acute infarction, hemorrhage, hydrocephalus, extra-axial collection or mass lesion/mass effect. Vascular: No hyperdense vessel or unexpected calcification. Skull: Normal. Negative for fracture or focal lesion. Sinuses/Orbits: No acute finding. Other: None. IMPRESSION: 1. No acute intracranial abnormalities. Electronically Signed   By: Signa Kell M.D.   On: 05/13/2017 15:17   Dg Chest Portable 1 View  Result Date: 05/13/2017 CLINICAL DATA:  Altered mental status EXAM: PORTABLE CHEST 1 VIEW COMPARISON:  June 18, 2013 FINDINGS: There is no edema or consolidation. Heart is upper normal in size with pulmonary vascularity within normal limits. No adenopathy. No pneumothorax. No bone lesions. IMPRESSION: No edema or consolidation. Electronically Signed   By: Bretta Bang III M.D.   On: 05/13/2017 18:08    MDM  The patient did start opening his eyes and answering questions yes and no.  This is the patient's baseline for answering questions but is still somewhat somnolent.  I feel that this is a medication related issue.  The patient will follow commands he will raise his legs and squeeze my patient was given Narcan initially which did not seem to help his unresponsiveness.     Took patient care handoff report from [facility], PA-Schroeder.    Plan: Reevaluate  patient.  If patient is not back to his baseline within the next couple hours, admit patient.   Patient presents with altered mental status.  He did show some improvement during his ED course, however, he did not return to his baseline.  He is maintaining his own airway and has adequate respirations.  He does have some evidence of possible UTI, which was addressed. Patient is nontoxic appearing, afebrile, not tachycardic, not tachypneic, not hypotensive, maintains SPO2 of 100% on room air, and is in no apparent distress.  Patient does have a mildly elevated ammonia, however, LFTs are normal. Source of the patient's altered mental status could be medication related.  Admission due to continued altered mental status.    Vitals:   05/13/17 1425 05/13/17 1430 05/13/17 1510 05/13/17 1545  BP:  130/79 116/76 110/67  Pulse:   (!) 56 62  Resp:   18 11  Temp: 98.7 F (37.1 Schroeder)     TempSrc: Rectal     SpO2:  100% 99%  Weight:       Vitals:   05/13/17 1730 05/13/17 1800 05/13/17 1803 05/13/17 1830  BP: 101/68 (!) 114/53 (!) 114/53 98/61  Pulse: 69 (!) 58 (!) 54 74  Resp: 14 19 19 16   Temp:      TempSrc:      SpO2: 97% 98% 93% 96%  Weight:               Charlestine NightLawyer, Pamula Luther, PA-Schroeder 05/17/17 1539    Kylee Nardozzi, [place], PA-Schroeder 05/17/17 1539    Azalia Bilisampos, Kevin, MD 05/18/17 [phone]

## 2017-05-13 NOTE — ED Notes (Signed)
X-ray at bedside

## 2017-05-14 ENCOUNTER — Encounter (HOSPITAL_COMMUNITY): Payer: Self-pay | Admitting: *Deleted

## 2017-05-14 ENCOUNTER — Other Ambulatory Visit: Payer: Self-pay

## 2017-05-14 DIAGNOSIS — G9341 Metabolic encephalopathy: Secondary | ICD-10-CM

## 2017-05-14 DIAGNOSIS — N182 Chronic kidney disease, stage 2 (mild): Secondary | ICD-10-CM | POA: Diagnosis present

## 2017-05-14 DIAGNOSIS — G92 Toxic encephalopathy: Secondary | ICD-10-CM | POA: Diagnosis present

## 2017-05-14 DIAGNOSIS — T443X1A Poisoning by other parasympatholytics [anticholinergics and antimuscarinics] and spasmolytics, accidental (unintentional), initial encounter: Secondary | ICD-10-CM | POA: Diagnosis present

## 2017-05-14 DIAGNOSIS — F79 Unspecified intellectual disabilities: Secondary | ICD-10-CM | POA: Diagnosis present

## 2017-05-14 DIAGNOSIS — F209 Schizophrenia, unspecified: Secondary | ICD-10-CM | POA: Diagnosis not present

## 2017-05-14 DIAGNOSIS — D509 Iron deficiency anemia, unspecified: Secondary | ICD-10-CM | POA: Diagnosis present

## 2017-05-14 DIAGNOSIS — T424X1A Poisoning by benzodiazepines, accidental (unintentional), initial encounter: Secondary | ICD-10-CM | POA: Diagnosis present

## 2017-05-14 DIAGNOSIS — N3000 Acute cystitis without hematuria: Secondary | ICD-10-CM | POA: Diagnosis not present

## 2017-05-14 DIAGNOSIS — R400 Somnolence: Secondary | ICD-10-CM | POA: Diagnosis not present

## 2017-05-14 DIAGNOSIS — N390 Urinary tract infection, site not specified: Secondary | ICD-10-CM | POA: Diagnosis present

## 2017-05-14 LAB — COMPREHENSIVE METABOLIC PANEL
ALK PHOS: 71 U/L (ref 38–126)
ALT: 34 U/L (ref 17–63)
AST: 23 U/L (ref 15–41)
Albumin: 3 g/dL — ABNORMAL LOW (ref 3.5–5.0)
Anion gap: 11 (ref 5–15)
BILIRUBIN TOTAL: 0.7 mg/dL (ref 0.3–1.2)
BUN: 10 mg/dL (ref 6–20)
CALCIUM: 8.5 mg/dL — AB (ref 8.9–10.3)
CO2: 22 mmol/L (ref 22–32)
Chloride: 106 mmol/L (ref 101–111)
Creatinine, Ser: 1.28 mg/dL — ABNORMAL HIGH (ref 0.61–1.24)
GFR, EST NON AFRICAN AMERICAN: 59 mL/min — AB (ref >60)
Glucose, Bld: 87 mg/dL (ref 65–99)
Potassium: 3.9 mmol/L (ref 3.5–5.1)
Sodium: 139 mmol/L (ref 135–145)
Total Protein: 6.6 g/dL (ref 6.5–8.1)

## 2017-05-14 LAB — HIV ANTIBODY (ROUTINE TESTING W REFLEX): HIV Screen 4th Generation wRfx: NONREACTIVE

## 2017-05-14 LAB — MRSA PCR SCREENING: MRSA BY PCR: NEGATIVE

## 2017-05-14 MED ORDER — SODIUM CHLORIDE 0.9 % IV SOLN
INTRAVENOUS | Status: AC
  Administered 2017-05-14 (×2): via INTRAVENOUS
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
## 2017-05-14 NOTE — Clinical Social Work Note (Signed)
CSW was asked to call group home as MD requested to know patient's baseline. CSW talked with group home owner and director Jeannett SeniorJosephine Okeke [phone]) regarding patient's baseline. Per Ms. Okeke, patient is quiet and primarily watches TV. Patient will respond if someone says something to him or if he is asked a question. Per Ms. Bonita Quinkeke, Terry Schroeder will let her know if he needs something and he refers to the day center he goes to as a school. Ms. Rockney GheeOkeke was thanked for this information.   Genelle BalVanessa Tamyia Minich, MSW, LCSW Licensed Clinical Social Worker Clinical Social Work Department [facility] [phone]

## 2017-05-14 NOTE — Progress Notes (Signed)
   05/14/17 1300  Clinical Encounter Type  Visited With Patient  Visit Type Initial  Referral From Chaplain;Nurse  Consult/Referral To Chaplain  Spiritual Encounters  Spiritual Needs Emotional  Stress Factors  Patient Stress Factors Exhausted  Family Stress Factors Exhausted   Patient was in bed listening to radio. No family members on-site. Patient seemed to be emotionally distressed. I provided emotional support through my compassionate presence.  Cullan Launer a Water quality scientistMusiko-Holley, E. I. du PontChaplain
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
## 2017-05-15 DIAGNOSIS — F209 Schizophrenia, unspecified: Secondary | ICD-10-CM

## 2017-05-15 LAB — BASIC METABOLIC PANEL
ANION GAP: 10 (ref 5–15)
BUN: 9 mg/dL (ref 6–20)
CALCIUM: 9.1 mg/dL (ref 8.9–10.3)
CO2: 23 mmol/L (ref 22–32)
Chloride: 106 mmol/L (ref 101–111)
Creatinine, Ser: 1.07 mg/dL (ref 0.61–1.24)
GFR calc non Af Amer: >60 mL/min (ref >60)
Glucose, Bld: 85 mg/dL (ref 65–99)
Potassium: 4 mmol/L (ref 3.5–5.1)
Sodium: 139 mmol/L (ref 135–145)

## 2017-05-15 MED ORDER — DEXTROSE 5 % IV SOLN
1.0000 g | INTRAVENOUS | Status: AC
  Administered 2017-05-15: 1 g via INTRAVENOUS
  Filled 2017-05-15: qty 10

## 2017-05-15 NOTE — Clinical Social Work Note (Signed)
CSW reviewed CSW Intern Noel Journeyamara Meadows assessment and discharge notes on patient and is approving them as written. Patient has discharged back to Group Home.  Genelle BalVanessa Taevion Sikora, MSW, LCSW Licensed Clinical Social Worker Clinical Social Work Department [facility] [phone]

## 2017-05-15 NOTE — [place] FL2 (Signed)
[place] MEDICAID FL2 LEVEL OF CARE SCREENING TOOL     IDENTIFICATION  Patient Name: Terry AlbinoJimmy L Schroeder Birthdate: 05/17/1955 Sex: male Admission Date (Current Location): 05/13/2017  [place]  [place] [id number] N Facility and Address:  The . [facility], [address], [place], [place] [address]      Provider Number: [id number]  Attending Physician Name and Address:  Elease EtienneHongalgi, Anand D, MD  Relative Name and Phone Number:  Jeannett SeniorJosephine Okeke [phone]    Current Level of Care: Other (Comment)(group home) Recommended Level of Care: Other (Comment) Prior Approval Number:    Date Approved/Denied:   PASRR Number:    Discharge Plan: Other (Comment)(group home )    Current Diagnoses: Patient Active Problem List   Diagnosis Date Noted  . Metabolic encephalopathy 05/13/2017  . UTI (urinary tract infection) 05/13/2017  . Angina at rest [facility](HCC) 06/18/2013  . Precordial pain 06/18/2013  . Shortness of breath   . Nonspecific abnormal electrocardiogram (ECG) (EKG)   . Mentally challenged   . Schizophrenia (HCC)     Orientation RESPIRATION BLADDER Height & Weight     Place, Situation, Self  Normal Continent Weight: 154 lb 5.2 oz (70 kg) Height:  5\' 6"  (167.6 cm)  BEHAVIORAL SYMPTOMS/MOOD NEUROLOGICAL BOWEL NUTRITION STATUS  Other (Comment)(mental retardation )   Continent Diet(Low sodium- heart healthy)  AMBULATORY STATUS COMMUNICATION OF NEEDS Skin   Independent Verbally Normal                       Personal Care Assistance Level of Assistance  Bathing, Dressing, Feeding Bathing Assistance: Independent Feeding assistance: Independent Dressing Assistance: Independent Total Care Assistance: Limited assistance   Functional Limitations Info  Sight, Hearing, Speech Sight Info: Adequate Hearing Info: Adequate Speech Info: Adequate    SPECIAL CARE FACTORS FREQUENCY                       Contractures Contractures Info:  Not present    Additional Factors Info  Code Status, Allergies Code Status Info: Full  Allergies Info: No known allergies            Current Medications (05/15/2017):  This is the current hospital active medication list Current Facility-Administered Medications  Medication Dose Route Frequency Provider Last Rate Last Dose  . ARIPiprazole (ABILIFY) tablet 5 mg  5 mg Oral Daily Rometta EmeryGarba, Mohammad L, MD   5 mg at 05/15/17 0910  . benztropine (COGENTIN) tablet 1 mg  1 mg Oral QHS Rometta EmeryGarba, Mohammad L, MD   1 mg at 05/14/17 2224  . cefTRIAXone (ROCEPHIN) 1 g in dextrose 5 % 50 mL IVPB  1 g Intravenous Q24H Hongalgi, Anand D, MD      . enoxaparin (LOVENOX) injection 40 mg  40 mg Subcutaneous Q24H Earlie LouGarba, Mohammad L, MD   40 mg at 05/15/17 0910  . LORazepam (ATIVAN) tablet 0.5 mg  0.5 mg Oral BID Rometta EmeryGarba, Mohammad L, MD   0.5 mg at 05/15/17 0911     Discharge Medications: Please see discharge summary for a list of discharge medications.  Relevant Imaging Results:  Relevant Lab Results:   Additional Information DISCHARGE MEDICATIONS   TAKE these medications   ARIPiprazole 5 MG tablet Commonly known as:  ABILIFY Take 5 mg by mouth daily.   benztropine 1 MG tablet Commonly known as:  COGENTIN Take 1 mg by mouth at bedtime.   LORazepam 0.5 MG tablet Commonly known as:  ATIVAN Take  0.5 mg by mouth 2 (two) times daily      Waldemar Dickens, [place] Work [phone]

## 2017-05-15 NOTE — Discharge Summary (Signed)
Physician Discharge Summary  Terry Schroeder UJW:[id number] DOB: 01/22/1956  PCP: System, Pcp Not In  Admit date: 05/13/2017 Discharge date: 05/15/2017  Recommendations for Outpatient Follow-up:  1. PCP in 1 week.  Please follow final urine culture results that were sent from the hospital.  Home Health: None Equipment/Devices: None  Discharge Condition: Improved and stable CODE STATUS: Full Diet recommendation: Heart healthy diet.  Discharge Diagnoses:  Principal Problem:   Metabolic encephalopathy Active Problems:   Schizophrenia (HCC)   UTI (urinary tract infection)   Brief Summary: 62 year old male, group home resident, PMH of mental retardation and schizophrenia was found by EMS at adult daycare facility obtunded and apparently had gotten double dose of some medications (Cogentin and Ativan, per RN report).  Admitted for acute encephalopathy and possible UTI.  CT head, UDS negative.  Mental status changes resolved.   Assessment & Plan:   1. Acute toxic metabolic encephalopathy: CT head without acute findings.  Chest x-ray without pneumonia.  Urine microscopy showed many bacteria and pyuria. Suspected due to medications (Cogentin and Ativan) per report.  Resolved and suspect his mental status is back to baseline.  Reviewed clinical social workers note from 1/23 and discussion with group home staff regarding his baseline mental status. 2. Acute cystitis versus asymptomatic bacteriuria (gram-negative rods >100 K): Afebrile, no leukocytosis and difficult history from patient due to mental retardation.  Empirically started on IV ceftriaxone.  Patient denies dysuria today.  Complete 3 days course of IV ceftriaxone today prior to discharge and no antibiotics at discharge.  Follow final urine culture results. 3. Schizophrenia: Continue Abilify, benztropine and Ativan.  Stable. 4. Mental retardation: Mental status likely at baseline. 5. Microcytic anemia: Mildly reduced hemoglobin.   Stable.  Outpatient follow-up. 6. Stage II chronic kidney disease: Creatinine has mildly gone up from 1.18-1.28.  Gentle hydration.  Creatinine has normalized.  Follow BMP periodically as outpatient.   Consultants:  None  Procedures:  None    Discharge Instructions  Discharge Instructions    Call MD for:   Complete by:  As directed    Recurrent confusion or altered mental status.   Call MD for:  severe uncontrolled pain   Complete by:  As directed    Call MD for:  temperature >100.4   Complete by:  As directed    Diet - low sodium heart healthy   Complete by:  As directed    Increase activity slowly   Complete by:  As directed        Medication List    TAKE these medications   ARIPiprazole 5 MG tablet Commonly known as:  ABILIFY Take 5 mg by mouth daily.   benztropine 1 MG tablet Commonly known as:  COGENTIN Take 1 mg by mouth at bedtime.   LORazepam 0.5 MG tablet Commonly known as:  ATIVAN Take 0.5 mg by mouth 2 (two) times daily.      Follow-up Information    Family Physician,of choice. Schedule an appointment as soon as possible for a visit in 1 week(s).          No Known Allergies    Procedures/Studies: Ct Head Wo Contrast  Result Date: 05/13/2017 CLINICAL DATA:  Altered mental status. EXAM: CT HEAD WITHOUT CONTRAST TECHNIQUE: Contiguous axial images were obtained from the base of the skull through the vertex without intravenous contrast. COMPARISON:  None FINDINGS: Brain: No evidence of acute infarction, hemorrhage, hydrocephalus, extra-axial collection or mass lesion/mass effect. Vascular: No hyperdense vessel or unexpected calcification.  Skull: Normal. Negative for fracture or focal lesion. Sinuses/Orbits: No acute finding. Other: None. IMPRESSION: 1. No acute intracranial abnormalities. Electronically Signed   By: Signa Kell M.D.   On: 05/13/2017 15:17   Dg Chest Portable 1 View  Result Date: 05/13/2017 CLINICAL DATA:  Altered mental  status EXAM: PORTABLE CHEST 1 VIEW COMPARISON:  June 18, 2013 FINDINGS: There is no edema or consolidation. Heart is upper normal in size with pulmonary vascularity within normal limits. No adenopathy. No pneumothorax. No bone lesions. IMPRESSION: No edema or consolidation. Electronically Signed   By: Bretta Bang III M.D.   On: 05/13/2017 18:08      Subjective: Denies complaints.  No pain reported.  No dysuria.  As per RN, no acute issues noted.  Discharge Exam:  Vitals:   05/14/17 1515 05/14/17 2000 05/15/17 0417 05/15/17 0809  BP: (!) 98/56 109/74 117/80 110/83  Pulse: 88 86 80 76  Resp: 16 16 16 16   Temp: 98.3 F (36.8 C) 97.9 F (36.6 C) 97.7 F (36.5 C) 97.6 F (36.4 C)  TempSrc: Oral   Oral  SpO2: 99% 97% 98% 99%  Weight:  70 kg (154 lb 5.2 oz)    Height:        General exam: Pleasant middle-aged male, moderately built and nourished, lying in bed and watching TV, smiling. Respiratory system: Clear to auscultation. Respiratory effort normal. Cardiovascular system: S1 & S2 heard, RRR. No JVD, murmurs, rubs, gallops or clicks. No pedal edema. Gastrointestinal system: Abdomen is nondistended, soft and nontender. No organomegaly or masses felt. Normal bowel sounds heard. Central nervous system:  Alert and oriented to person and partly to place. No focal neurological deficits.  Has cognitive impairment, chronic. Extremities: Symmetric 5 x 5 power. Skin: No rashes, lesions or ulcers Psychiatry: Judgement and insight impaired. Mood & affect  pleasant.       The results of significant diagnostics from this hospitalization (including imaging, microbiology, ancillary and laboratory) are listed below for reference.     Microbiology: Recent Results (from the past 240 hour(s))  Urine culture     Status: Abnormal (Preliminary result)   Collection Time: 05/13/17 11:25 PM  Result Value Ref Range Status   Specimen Description URINE, CLEAN CATCH  Final   Special Requests  NONE  Final   Culture >=100,000 COLONIES/mL GRAM NEGATIVE RODS (A)  Final   Report Status PENDING  Incomplete  MRSA PCR Screening     Status: None   Collection Time: 05/14/17  2:17 AM  Result Value Ref Range Status   MRSA by PCR NEGATIVE NEGATIVE Final    Comment:        The GeneXpert MRSA Assay (FDA approved for NASAL specimens only), is one component of a comprehensive MRSA colonization surveillance program. It is not intended to diagnose MRSA infection nor to guide or monitor treatment for MRSA infections.      Labs: CBC: Recent Labs  Lab 05/13/17 1324 05/13/17 2300  WBC 7.8 10.6*  NEUTROABS 5.4 7.7  HGB 12.2* 12.2*  HCT 37.8* 37.5*  MCV 71.3* 70.9*  PLT 223 209   Basic Metabolic Panel: Recent Labs  Lab 05/13/17 1324 05/13/17 2300 05/14/17 0533 05/15/17 0703  NA 138  --  139 139  K 3.9  --  3.9 4.0  CL 104  --  106 106  CO2 22  --  22 23  GLUCOSE 91  --  87 85  BUN 9  --  10 9  CREATININE 1.18  1.25* 1.28* 1.07  CALCIUM 8.9  --  8.5* 9.1   Liver Function Tests: Recent Labs  Lab 05/13/17 1324 05/14/17 0533  AST 29 23  ALT 39 34  ALKPHOS 81 71  BILITOT 0.5 0.7  PROT 7.1 6.6  ALBUMIN 3.4* 3.0*   CBG: Recent Labs  Lab 05/13/17 1316  GLUCAP 104*   Urinalysis    Component Value Date/Time   COLORURINE YELLOW 05/13/2017 1551   APPEARANCEUR CLEAR 05/13/2017 1551   LABSPEC 1.008 05/13/2017 1551   PHURINE 5.0 05/13/2017 1551   GLUCOSEU NEGATIVE 05/13/2017 1551   HGBUR SMALL (A) 05/13/2017 1551   BILIRUBINUR NEGATIVE 05/13/2017 1551   KETONESUR NEGATIVE 05/13/2017 1551   PROTEINUR NEGATIVE 05/13/2017 1551   NITRITE NEGATIVE 05/13/2017 1551   LEUKOCYTESUR SMALL (A) 05/13/2017 1551      Time coordinating discharge: Less than 30 minutes  SIGNED:  Marcellus Scott, MD, FACP, [facility]. [facility] Pager [phone]  If 7PM-7AM, please contact night-coverage [email or web address] Password [facility] 05/15/2017, 10:38 AM

## 2017-05-15 NOTE — Discharge Instructions (Signed)

## 2017-05-15 NOTE — Progress Notes (Signed)
Brett AlbinoJimmy L Detlefsen to be D/C'd Home per MD order.  Discussed prescriptions and follow up appointments with the patient. Prescriptions given to patient, medication list explained in detail. Pt verbalized understanding.  Allergies as of 05/15/2017   No Known Allergies     Medication List    TAKE these medications   ARIPiprazole 5 MG tablet Commonly known as:  ABILIFY Take 5 mg by mouth daily.   benztropine 1 MG tablet Commonly known as:  COGENTIN Take 1 mg by mouth at bedtime.   LORazepam 0.5 MG tablet Commonly known as:  ATIVAN Take 0.5 mg by mouth 2 (two) times daily.       Vitals:   05/15/17 0417 05/15/17 0809  BP: 117/80 110/83  Pulse: 80 76  Resp: 16 16  Temp: 97.7 F (36.5 C) 97.6 F (36.4 C)  SpO2: 98% 99%    Skin clean, dry and intact without evidence of skin break down, no evidence of skin tears noted. IV catheter discontinued intact. Site without signs and symptoms of complications. Dressing and pressure applied. Pt denies pain at this time. No complaints noted.  An After Visit Summary was printed and given to the patient. Patient escorted via WC, and D/C home via private auto.  Britt BologneseAnisha Mabe RN, BSN

## 2017-05-15 NOTE — Clinical Social Work Note (Signed)
Clinical Social Work Assessment  Patient Details  Name: Terry Schroeder MRN: [id number] Date of Birth: 03/19/1956  Date of referral:  05/15/17               Reason for consult:  Discharge Planning                Permission sought to share information with:  Guardian Permission granted to share information::  Yes, Verbal Permission Granted  Name::     Terry Schroeder  Agency::  Group Home   Relationship::  Group Home Director   Contact Information:  [phone]  Housing/Transportation Living arrangements for the past 2 months:  Group Home Source of Information:  Other (Comment Required)(Group Manufacturing systems engineerHome director ) Patient Interpreter Needed:  None Criminal Activity/Legal Involvement Pertinent to Current Situation/Hospitalization:  No - Comment as needed Significant Relationships:  None Lives with:  Facility Resident Do you feel safe going back to the place where you live?  Yes Need for family participation in patient care:  No (Coment)  Care giving concerns:  Terry Schroeder is from a group home and will return there at discharge. No concerns expressed.    Social Worker assessment / plan: CSW and CSW intern spoke with Ms. Terry SaundersJosephine Schroeder director of the group home  regarding Terry Schroeder's discharge back to the group home. Terry Schroeder reported that pt has been at the group home  since 2009 and has no known family members. Terry Schroeder expressed no concerns regarding Terry Schroeder returning  to the group home.   Employment status:  Disabled (Comment on whether or not currently receiving Disability)(mental retardation ) Insurance information:  Medicare, [place] PT Recommendations:    Information / Referral to community resources:  Skilled Nursing Facility  Patient/Family's Response to care: No concerns expressed regarding pt's care during hospitalization.  Patient/Family's Understanding of and Emotional Response to Diagnosis, Current Treatment, and Prognosis: Terry Schroeder  expressed understanding in pt's baseline and current medical condition.   Emotional Assessment Appearance:  Well-Groomed Attitude/Demeanor/Rapport:  Unable to Assess Affect (typically observed):  Unable to Assess Orientation:  Oriented to Self Alcohol / Substance use:  Never Used Psych involvement (Current and /or in the community):     Discharge Needs  Concerns to be addressed:  No discharge needs identified Readmission within the last 30 days:  No Current discharge risk:  None Barriers to Discharge:  No Barriers Identified   Terry Schroeder, Student-Social Work 05/15/2017, 3:46 PM

## 2017-05-15 NOTE — Care Management Note (Addendum)
Case Management Note  Patient Details  Name: Terry AlbinoJimmy L Shanker MRN: [id number] Date of Birth: 07/10/1955  Subjective/Objective:   Admitted for Altered mental status               Action/Plan: Prior to admission patient lived a group home.  [facility] [place], [place].  Follow up appointment is May 22, 1017 at 2:00pm at [facility] [address], [place], [place] [address].  Discharge summary faxed to [facility] at [phone].  Expected Discharge Date:  05/15/17               Expected Discharge Plan:  Group Home  Discharge planning Services  CM Consult, Follow-up appt scheduled: Thursday May 22, 2017 at 2:00PM.    Status of Service:  Completed, signed off  Yancey FlemingsKimberly R Becton, RN  102M-Nurse case manager [phone] 05/15/2017, 10:57 AM

## 2017-05-15 NOTE — Social Work (Signed)
Patient is medically stable for discharge back to Group Home. Terry Schroeder  [phone]) contacted and informed of discharge. Per Terry. Terry Schroeder, a staff person will transport patient back to group home. CSW intern signing off as no other social work services needed.   Terry Schroeder, CSW Intern

## 2017-05-16 LAB — URINE CULTURE

## 2017-05-22 DIAGNOSIS — N3281 Overactive bladder: Secondary | ICD-10-CM | POA: Diagnosis not present

## 2017-05-22 DIAGNOSIS — F209 Schizophrenia, unspecified: Secondary | ICD-10-CM | POA: Diagnosis not present

## 2017-05-22 DIAGNOSIS — J00 Acute nasopharyngitis [common cold]: Secondary | ICD-10-CM | POA: Diagnosis not present

## 2017-05-22 DIAGNOSIS — J069 Acute upper respiratory infection, unspecified: Secondary | ICD-10-CM | POA: Diagnosis not present

## 2017-06-10 DIAGNOSIS — F209 Schizophrenia, unspecified: Secondary | ICD-10-CM | POA: Diagnosis not present

## 2017-06-10 DIAGNOSIS — J302 Other seasonal allergic rhinitis: Secondary | ICD-10-CM | POA: Diagnosis not present

## 2017-06-10 DIAGNOSIS — F419 Anxiety disorder, unspecified: Secondary | ICD-10-CM | POA: Diagnosis not present

## 2017-06-16 DIAGNOSIS — F209 Schizophrenia, unspecified: Secondary | ICD-10-CM | POA: Diagnosis not present

## 2017-09-01 DIAGNOSIS — F209 Schizophrenia, unspecified: Secondary | ICD-10-CM | POA: Diagnosis not present

## 2017-09-09 DIAGNOSIS — J302 Other seasonal allergic rhinitis: Secondary | ICD-10-CM | POA: Diagnosis not present

## 2017-09-09 DIAGNOSIS — H6122 Impacted cerumen, left ear: Secondary | ICD-10-CM | POA: Diagnosis not present

## 2017-09-09 DIAGNOSIS — N3281 Overactive bladder: Secondary | ICD-10-CM | POA: Diagnosis not present

## 2017-09-09 DIAGNOSIS — F209 Schizophrenia, unspecified: Secondary | ICD-10-CM | POA: Diagnosis not present

## 2017-12-03 DIAGNOSIS — F209 Schizophrenia, unspecified: Secondary | ICD-10-CM | POA: Diagnosis not present

## 2017-12-15 DIAGNOSIS — Z131 Encounter for screening for diabetes mellitus: Secondary | ICD-10-CM | POA: Diagnosis not present

## 2017-12-15 DIAGNOSIS — Z13220 Encounter for screening for lipoid disorders: Secondary | ICD-10-CM | POA: Diagnosis not present

## 2017-12-15 DIAGNOSIS — H6121 Impacted cerumen, right ear: Secondary | ICD-10-CM | POA: Diagnosis not present

## 2017-12-15 DIAGNOSIS — F209 Schizophrenia, unspecified: Secondary | ICD-10-CM | POA: Diagnosis not present

## 2018-03-10 DIAGNOSIS — J302 Other seasonal allergic rhinitis: Secondary | ICD-10-CM | POA: Diagnosis not present

## 2018-03-10 DIAGNOSIS — Z23 Encounter for immunization: Secondary | ICD-10-CM | POA: Diagnosis not present

## 2018-03-10 DIAGNOSIS — G4730 Sleep apnea, unspecified: Secondary | ICD-10-CM | POA: Diagnosis not present

## 2018-03-10 DIAGNOSIS — F209 Schizophrenia, unspecified: Secondary | ICD-10-CM | POA: Diagnosis not present

## 2018-03-13 DIAGNOSIS — N399 Disorder of urinary system, unspecified: Secondary | ICD-10-CM | POA: Diagnosis not present

## 2018-03-13 DIAGNOSIS — R300 Dysuria: Secondary | ICD-10-CM | POA: Diagnosis not present

## 2018-03-23 DIAGNOSIS — F209 Schizophrenia, unspecified: Secondary | ICD-10-CM | POA: Diagnosis not present

## 2021-03-15 ENCOUNTER — Emergency Department (HOSPITAL_COMMUNITY): Payer: Medicare Other

## 2021-03-15 ENCOUNTER — Observation Stay (HOSPITAL_COMMUNITY): Payer: Medicare Other

## 2021-03-15 ENCOUNTER — Other Ambulatory Visit: Payer: Self-pay

## 2021-03-15 ENCOUNTER — Observation Stay (HOSPITAL_COMMUNITY)
Admission: EM | Admit: 2021-03-15 | Discharge: 2021-03-17 | Disposition: A | Discharge status: Home / Self Care | Payer: Medicare Other | Attending: Internal Medicine | Admitting: Internal Medicine

## 2021-03-15 ENCOUNTER — Encounter (HOSPITAL_COMMUNITY): Payer: Self-pay | Admitting: Emergency Medicine

## 2021-03-15 DIAGNOSIS — K029 Dental caries, unspecified: Secondary | ICD-10-CM | POA: Diagnosis not present

## 2021-03-15 DIAGNOSIS — Z20822 Contact with and (suspected) exposure to covid-19: Secondary | ICD-10-CM | POA: Diagnosis not present

## 2021-03-15 DIAGNOSIS — R4182 Altered mental status, unspecified: Secondary | ICD-10-CM

## 2021-03-15 DIAGNOSIS — Z79899 Other long term (current) drug therapy: Secondary | ICD-10-CM | POA: Insufficient documentation

## 2021-03-15 DIAGNOSIS — M47812 Spondylosis without myelopathy or radiculopathy, cervical region: Secondary | ICD-10-CM | POA: Insufficient documentation

## 2021-03-15 DIAGNOSIS — F209 Schizophrenia, unspecified: Secondary | ICD-10-CM | POA: Insufficient documentation

## 2021-03-15 DIAGNOSIS — R4189 Other symptoms and signs involving cognitive functions and awareness: Secondary | ICD-10-CM

## 2021-03-15 DIAGNOSIS — G9340 Encephalopathy, unspecified: Principal | ICD-10-CM | POA: Insufficient documentation

## 2021-03-15 DIAGNOSIS — F79 Unspecified intellectual disabilities: Secondary | ICD-10-CM | POA: Insufficient documentation

## 2021-03-15 DIAGNOSIS — I341 Nonrheumatic mitral (valve) prolapse: Secondary | ICD-10-CM | POA: Diagnosis not present

## 2021-03-15 DIAGNOSIS — Y900 Blood alcohol level of less than 20 mg/100 ml: Secondary | ICD-10-CM | POA: Diagnosis not present

## 2021-03-15 DIAGNOSIS — R29810 Facial weakness: Secondary | ICD-10-CM | POA: Diagnosis present

## 2021-03-15 DIAGNOSIS — N182 Chronic kidney disease, stage 2 (mild): Secondary | ICD-10-CM | POA: Insufficient documentation

## 2021-03-15 LAB — COMPREHENSIVE METABOLIC PANEL
ALT: 29 U/L (ref 0–44)
AST: 28 U/L (ref 15–41)
Albumin: 3.8 g/dL (ref 3.5–5.0)
Alkaline Phosphatase: 65 U/L (ref 38–126)
Anion gap: 7 (ref 5–15)
BUN: 11 mg/dL (ref 8–23)
CO2: 25 mmol/L (ref 22–32)
Calcium: 9.1 mg/dL (ref 8.9–10.3)
Chloride: 105 mmol/L (ref 98–111)
Creatinine, Ser: 1.25 mg/dL — ABNORMAL HIGH (ref 0.61–1.24)
GFR, Estimated: >60 mL/min (ref >60)
Glucose, Bld: 103 mg/dL — ABNORMAL HIGH (ref 70–99)
Potassium: 4 mmol/L (ref 3.5–5.1)
Sodium: 137 mmol/L (ref 135–145)
Total Bilirubin: 0.7 mg/dL (ref 0.3–1.2)
Total Protein: 7.1 g/dL (ref 6.5–8.1)

## 2021-03-15 LAB — I-STAT ARTERIAL BLOOD GAS, ED
Acid-Base Excess: 3 mmol/L — ABNORMAL HIGH (ref 0.0–2.0)
Bicarbonate: 26.4 mmol/L (ref 20.0–28.0)
Calcium, Ion: 1.18 mmol/L (ref 1.15–1.40)
HCT: 41 % (ref 39.0–52.0)
Hemoglobin: 13.9 g/dL (ref 13.0–17.0)
O2 Saturation: 100 %
Patient temperature: 98.6
Potassium: 3.7 mmol/L (ref 3.5–5.1)
Sodium: 137 mmol/L (ref 135–145)
TCO2: 27 mmol/L (ref 22–32)
pCO2 arterial: 35.1 mmHg (ref 32.0–48.0)
pH, Arterial: 7.484 — ABNORMAL HIGH (ref 7.350–7.450)
pO2, Arterial: 165 mmHg — ABNORMAL HIGH (ref 83.0–108.0)

## 2021-03-15 LAB — I-STAT CHEM 8, ED
BUN: 16 mg/dL (ref 8–23)
Calcium, Ion: 1 mmol/L — ABNORMAL LOW (ref 1.15–1.40)
Chloride: 106 mmol/L (ref 98–111)
Creatinine, Ser: 1.2 mg/dL (ref 0.61–1.24)
Glucose, Bld: 104 mg/dL — ABNORMAL HIGH (ref 70–99)
HCT: 43 % (ref 39.0–52.0)
Hemoglobin: 14.6 g/dL (ref 13.0–17.0)
Potassium: 4.1 mmol/L (ref 3.5–5.1)
Sodium: 139 mmol/L (ref 135–145)
TCO2: 26 mmol/L (ref 22–32)

## 2021-03-15 LAB — DIFFERENTIAL
Abs Immature Granulocytes: 0.02 10*3/uL (ref 0.00–0.07)
Basophils Absolute: 0 10*3/uL (ref 0.0–0.1)
Basophils Relative: 1 %
Eosinophils Absolute: 0 10*3/uL (ref 0.0–0.5)
Eosinophils Relative: 0 %
Immature Granulocytes: 0 %
Lymphocytes Relative: 11 %
Lymphs Abs: 0.8 10*3/uL (ref 0.7–4.0)
Monocytes Absolute: 0.3 10*3/uL (ref 0.1–1.0)
Monocytes Relative: 5 %
Neutro Abs: 6.2 10*3/uL (ref 1.7–7.7)
Neutrophils Relative %: 83 %

## 2021-03-15 LAB — URINALYSIS, ROUTINE W REFLEX MICROSCOPIC
Bilirubin Urine: NEGATIVE
Glucose, UA: NEGATIVE mg/dL
Hgb urine dipstick: NEGATIVE
Ketones, ur: NEGATIVE mg/dL
Leukocytes,Ua: NEGATIVE
Nitrite: NEGATIVE
Protein, ur: NEGATIVE mg/dL
Specific Gravity, Urine: 1.035 — ABNORMAL HIGH (ref 1.005–1.030)
pH: 6 (ref 5.0–8.0)

## 2021-03-15 LAB — RAPID URINE DRUG SCREEN, HOSP PERFORMED
Amphetamines: NOT DETECTED
Barbiturates: NOT DETECTED
Benzodiazepines: NOT DETECTED
Cocaine: NOT DETECTED
Opiates: NOT DETECTED
Tetrahydrocannabinol: NOT DETECTED

## 2021-03-15 LAB — CBC
HCT: 41.2 % (ref 39.0–52.0)
Hemoglobin: 13.2 g/dL (ref 13.0–17.0)
MCH: 22.6 pg — ABNORMAL LOW (ref 26.0–34.0)
MCHC: 32 g/dL (ref 30.0–36.0)
MCV: 70.7 fL — ABNORMAL LOW (ref 80.0–100.0)
Platelets: 226 10*3/uL (ref 150–400)
RBC: 5.83 MIL/uL — ABNORMAL HIGH (ref 4.22–5.81)
RDW: 14.4 % (ref 11.5–15.5)
WBC: 7.4 10*3/uL (ref 4.0–10.5)
nRBC: 0 % (ref 0.0–0.2)

## 2021-03-15 LAB — LACTIC ACID, PLASMA
Lactic Acid, Venous: 1.6 mmol/L (ref 0.5–1.9)
Lactic Acid, Venous: 1.6 mmol/L (ref 0.5–1.9)

## 2021-03-15 LAB — TROPONIN I (HIGH SENSITIVITY)
Troponin I (High Sensitivity): 4 ng/L (ref <18)
Troponin I (High Sensitivity): 3 ng/L (ref <18)

## 2021-03-15 LAB — ETHANOL: Alcohol, Ethyl (B): <10 mg/dL (ref <10)

## 2021-03-15 LAB — CBG MONITORING, ED: Glucose-Capillary: 96 mg/dL (ref 70–99)

## 2021-03-15 LAB — HIV ANTIBODY (ROUTINE TESTING W REFLEX): HIV Screen 4th Generation wRfx: NONREACTIVE

## 2021-03-15 LAB — RESP PANEL BY RT-PCR (FLU A&B, COVID) ARPGX2
Influenza A by PCR: NEGATIVE
Influenza B by PCR: NEGATIVE
SARS Coronavirus 2 by RT PCR: NEGATIVE

## 2021-03-15 LAB — AMMONIA: Ammonia: 28 umol/L (ref 9–35)

## 2021-03-15 LAB — PROTIME-INR
INR: 1 (ref 0.8–1.2)
Prothrombin Time: 13.1 seconds (ref 11.4–15.2)

## 2021-03-15 MED ORDER — IOHEXOL 350 MG/ML SOLN
100.0000 mL | Freq: Once | INTRAVENOUS | Status: AC | PRN
  Administered 2021-03-15: 100 mL via INTRAVENOUS

## 2021-03-15 MED ORDER — SODIUM CHLORIDE 0.9 % IV BOLUS
500.0000 mL | Freq: Once | INTRAVENOUS | Status: AC
  Administered 2021-03-15: 500 mL via INTRAVENOUS

## 2021-03-15 MED ORDER — SODIUM CHLORIDE 0.9 % IV SOLN: 250.0000 mL | INTRAVENOUS | Status: DC | PRN

## 2021-03-15 MED ORDER — SODIUM CHLORIDE 0.9% FLUSH: 3.0000 mL | INTRAVENOUS | Status: DC | PRN

## 2021-03-15 MED ORDER — ENOXAPARIN SODIUM 40 MG/0.4ML IJ SOSY
40.0000 mg | PREFILLED_SYRINGE | INTRAMUSCULAR | Status: DC
  Administered 2021-03-15 – 2021-03-16 (×2): 40 mg via SUBCUTANEOUS
  Filled 2021-03-15 (×2): qty 0.4

## 2021-03-15 MED ORDER — SODIUM CHLORIDE 0.9% FLUSH
3.0000 mL | Freq: Two times a day (BID) | INTRAVENOUS | Status: DC
  Administered 2021-03-15 – 2021-03-16 (×3): 3 mL via INTRAVENOUS

## 2021-03-15 MED ORDER — NALOXONE HCL 2 MG/2ML IJ SOSY
2.0000 mg | PREFILLED_SYRINGE | Freq: Once | INTRAMUSCULAR | Status: AC
  Administered 2021-03-15: 2 mg via INTRAVENOUS

## 2021-03-15 MED ORDER — FLUTICASONE PROPIONATE 50 MCG/ACT NA SUSP
2.0000 | Freq: Every day | NASAL | Status: DC
  Administered 2021-03-15 – 2021-03-16 (×2): 2 via NASAL
  Filled 2021-03-15: qty 16

## 2021-03-15 MED ORDER — NALOXONE HCL 2 MG/2ML IJ SOSY
2.0000 mg | PREFILLED_SYRINGE | Freq: Once | INTRAMUSCULAR | Status: AC
  Administered 2021-03-15: 2 mg via INTRAVENOUS
  Filled 2021-03-15: qty 2

## 2021-03-15 NOTE — ED Triage Notes (Signed)
Patient BIB [facility] from group home. Patient was found unresponsive in his room. Pt pin point pupils agonal respirations upon EMS arrival.

## 2021-03-15 NOTE — Consult Note (Signed)
Neurology Consultation  Reason for Consult: Code Stroke: Altered mental status  Referring Physician: Dr. Roslynn Amble  CC: Patient is unable to provide a chief complaint at this time due to AMS  History is obtained from: Chart review, unable to obtain from patient due to patient's mental status on arrival  HPI: Terry Schroeder is a 65 y.o. male with a medical history significant for schizophrenia, stage II chronic kidney disease, and mental delay who presented to the ED 11/24 from his group home via EMS for evaluation of altered mental status. Per patient's group home director, she was last with the patient at his baseline last night around dinner time. This morning patient seemed sleepy to staff but was able to feed himself breakfast and take his medications. This afternoon, staff found patient to be slumped over and non-responsive and EMS was activated for further evaluation. Patient was given a total of 3 mg of Narcan on EMS arrival without improvement in mental status and appeared initially to have a slight right mouth droop and a Code Stroke was activated for further evaluation.   LKW: Unclear, some time the evening of 11/23 TNK given?: no, patient's examination is non-focal on neurology assessment with low suspicion for acute infarction. CT imaging is without acute intracranial abnormality  IR Thrombectomy? No, no LVO on vessel imaging   ROS:  Unable to obtain due to altered mental status.   Past Medical History:  Diagnosis Date   Mentally challenged    Nonspecific abnormal electrocardiogram (ECG) (EKG)    Schizophrenia (HCC)    Shortness of breath    Past Surgical History:  Procedure Laterality Date   LEFT HEART CATHETERIZATION WITH CORONARY ANGIOGRAM N/A 06/18/2013   Procedure: LEFT HEART CATHETERIZATION WITH CORONARY ANGIOGRAM;  Surgeon: Jettie Booze, MD;  Location: [facility] CATH LAB;  Service: Cardiovascular;  Laterality: N/A;   History reviewed. No pertinent family  history.  Social History:   reports that he has never smoked. He has never used smokeless tobacco. He reports that he does not drink alcohol and does not use drugs.  Medications No current facility-administered medications for this encounter.  Current Outpatient Medications:    ARIPiprazole (ABILIFY) 5 MG tablet, Take 5 mg by mouth daily., Disp: , Rfl:    benztropine (COGENTIN) 1 MG tablet, Take 1 mg by mouth at bedtime. , Disp: , Rfl:    LORazepam (ATIVAN) 0.5 MG tablet, Take 0.5 mg by mouth 2 (two) times daily., Disp: , Rfl:   Exam: Current vital signs: BP 139/86   Pulse 84   Temp (!) 97.5 F (36.4 C) (Axillary)   Resp 15   SpO2 100%  Vital signs in last 24 hours: Temp:  [97.5 F (36.4 C)] 97.5 F (36.4 C) (11/24 1302) Pulse Rate:  [69-84] 84 (11/24 1330) Resp:  [15-23] 15 (11/24 1330) BP: (127-139)/(86-89) 139/86 (11/24 1330) SpO2:  [100 %] 100 % (11/24 1330)  GENERAL: Somnolent, arouses to noxious stimuli, laying comfortably in ER stretcher Psych: Unable to assess due to patient's mental status Head: Normocephalic and atraumatic, without obvious abnormality EENT: Normal conjunctivae, dry mucous membranes, no OP obstruction LUNGS: Normal respiratory effort. Non-labored breathing on room air. Patient does have a spontaneous cough.  CV: Regular rate and rhythm on telemetry ABDOMEN: Soft, non-tender, non-distended Extremities: warm, well perfused, without obvious deformity  NEURO:  Mental Status: Somnolent, arouses to noxious stimuli and quickly and frequently drifts back to sleep throughout assessment.  Patient states his first name correctly but states "I  don't know" or does not answer further orientation questions.  He is unable to provide a clear and coherent history of present illness. Speech/Language: speech is mildly dysarthric.   He is able to name 4/5 objects. Poor attention noted.  No neglect is noted. Cranial Nerves:  II: PERRL 2 mm/brisk.  III, IV, VI: Will  fixate and track examiner throughout visual fields.  V: Blinks to threat throughout.  VII: Face is symmetric resting and grimacing.  VIII: Hearing is intact to voice IX, X: Patient has a spontaneous cough.  XI: Head is grossly midline XII: Does not protrude tongue to command Motor: Patient's strength evaluation is limited due to his altered mental status and degree of participation. Patient will elevate bilateral upper extremities antigravity with slow vertical drift.  Right lower extremity elevates antigravity but drifts to bed, left lower extremity drifts but does not drift to bed. Tone is normal. Bulk is normal.  Sensation: Withdraws with light application of noxious stimuli throughout.   Coordination: No obvious dysmetria with upper extremity movements, does not perform HKS DTRs: 2+ and symmetric throughout.  Gait: Deferred  NIHSS: 1a Level of Conscious.: 2 1b LOC Questions: 2 1c LOC Commands: 0 2 Best Gaze: 0 3 Visual: 0 4 Facial Palsy: 0 5a Motor Arm - left: 1 5b Motor Arm - Right: 1 6a Motor Leg - Left: 1 6b Motor Leg - Right: 2 7 Limb Ataxia: 0 8 Sensory: 0 9 Best Language: 0 10 Dysarthria: 1 11 Extinct. and Inatten.: 0 TOTAL: 10  Labs I have reviewed labs in epic and the results pertinent to this consultation are: CBC    Component Value Date/Time   WBC 7.4 03/15/2021 1303   RBC 5.83 (H) 03/15/2021 1303   HGB 14.6 03/15/2021 1314   HCT 43.0 03/15/2021 1314   PLT 226 03/15/2021 1303   MCV 70.7 (L) 03/15/2021 1303   MCH 22.6 (L) 03/15/2021 1303   MCHC 32.0 03/15/2021 1303   RDW 14.4 03/15/2021 1303   LYMPHSABS 0.8 03/15/2021 1303   MONOABS 0.3 03/15/2021 1303   EOSABS 0.0 03/15/2021 1303   BASOSABS 0.0 03/15/2021 1303   CMP     Component Value Date/Time   NA 139 03/15/2021 1314   K 4.1 03/15/2021 1314   CL 106 03/15/2021 1314   CO2 23 05/15/2017 0703   GLUCOSE 104 (H) 03/15/2021 1314   BUN 16 03/15/2021 1314   CREATININE 1.20 03/15/2021 1314    CALCIUM 9.1 05/15/2017 0703   PROT 6.6 05/14/2017 0533   ALBUMIN 3.0 (L) 05/14/2017 0533   AST 23 05/14/2017 0533   ALT 34 05/14/2017 0533   ALKPHOS 71 05/14/2017 0533   BILITOT 0.7 05/14/2017 0533   GFRNONAA >60 05/15/2017 0703   GFRAA >60 05/15/2017 0703   Lipid Panel     Component Value Date/Time   CHOL 110 06/19/2013 0117   TRIG 40 06/19/2013 0117   HDL 62 06/19/2013 0117   CHOLHDL 1.8 06/19/2013 0117   VLDL 8 06/19/2013 0117   LDLCALC 40 06/19/2013 0117   No results found for: HGBA1C  Imaging I have reviewed the images obtained:  CT-scan of the brain 11/24: No evidence of acute intracranial abnormality.  ASPECTS of 10.  CT angio head neck wwo + CT cerebral perfusion 11/24: 1. No large vessel occlusion or flow-limiting proximal stenosis in the head or neck. 2. Negative CTP.  Routine EEG 11/24: "This study is suggestive of moderate diffuse encephalopathy, nonspecific etiology. No seizures or epileptiform discharges  were seen throughout the recording."  Assessment: 65 y.o. male who presented to the ED from his group home for evaluation of altered mental status with decreased level of consciousness.  - Examination reveals stuporous patient that is only oriented to self without focality on examination.  - CT imaging is without acute intracranial abnormality. Angiography imaging of the head and neck is without LVO or flow-limiting proximal stenosis of the head or neck with negative perfusion imaging.  - With non-focal exam and EEG imaging revealing encephalopathy of nonspecific etiology without seizures or epileptiform discharges, patient's presentation is felt most likely to be secondary to toxic-metabolic or infectious encephalopathy versus polypharmacy. Further work up pending.   Recommendations: - Recommend admission for encephalopathy - Infectious and metabolic work up per primary team - Appreciate pharmacy review of current medications - Neurology will re-examine  tomorrow  Pt seen by NP/Neuro and later by MD. Note/plan to be edited by MD as needed.  Lanae Boast, AGAC-NP [facility] Pager: [phone]  Neurology Attending Attestation   I examined the patient and discussed plan with Ms. Toberman NP. Above note has been edited by me to reflect my findings and recommendations. I was present throughout the stroke code and made all significant decisions and personally reviewed CNS imaging. Exam is nonfocal and CTH showed no acute findings, CTA showed no LVO, and CTP showed no perfusion deficit. EEG showed no epileptiform abnormalities. Suspect toxic/metabolic encephalopathy (patient has had previous admission for accidentally taking too much benzodiazepine). Will re-examine tomorrow. Given lack of focality, can hold off on MRI brain for now.   Bing Neighbors, MD [facility] [phone]   If 7pm- 7am, please page neurology on call as listed in AMION.

## 2021-03-15 NOTE — H&P (Addendum)
History and Physical    Terry Schroeder DOB: [birth year] DOA: 03/15/2021  PCP: Pcp, No Consultants:   Patient coming from:  group home   Chief Complaint: altered mental status  HPI: Terry Schroeder is a 65 y.o. male with medical history significant of MR, schizophrenia presenting to ED secondary to AMS. History from chart. Patient is asleep and is easily aroused, but goes back to sleep and doesn't answer questions. Was able to tell me he was in the hospital. Per group home director, patient was at his normal baseline last night around dinner. When he came to breakfast he seemed more lethargic. Later in day became unresponsive except to painful stimuli. He was slumped over in chair. EMS was called. Given 3mg  of Narcan without improvement in his mental status. He also initially had a slight right mouth droop and code stroke was activated.   ROS unable to be obtained.   ED Course: vitals: Temperature 97.5, blood pressure 130/87, heart rate 73, respiratory rate 16, oxygen 100% on room air. Pertinent labs: Unremarkable thus far.  Urine and UDS pending CT head shows no evidence of acute intracranial abnormality. CTA head and neck: Within normal limits EEG shows moderate diffuse encephalopathy, nonspecific etiology.  No seizures or epileptiform discharges were seen. In ED code stroke was initially activated and ruled out.  Allergy was originally consulted.  Lab work has been ultimately unremarkable.  Due to encephalopathy [facility] was asked to admit.  Review of Systems: unable to obtain  Ambulatory Status:  unknown    Past Medical History:  Diagnosis Date   Mentally challenged    Nonspecific abnormal electrocardiogram (ECG) (EKG)    Schizophrenia (HCC)    Shortness of breath     Past Surgical History:  Procedure Laterality Date   LEFT HEART CATHETERIZATION WITH CORONARY ANGIOGRAM N/A 06/18/2013   Procedure: LEFT HEART CATHETERIZATION WITH CORONARY ANGIOGRAM;  Surgeon: Corky Crafts, MD;  Location: [facility] CATH LAB;  Service: Cardiovascular;  Laterality: N/A;    Social History   Socioeconomic History   Marital status: Single    Spouse name: Not on file   Number of children: Not on file   Years of education: Not on file   Highest education level: Not on file  Occupational History   Occupation: Disabled  Tobacco Use   Smoking status: Never   Smokeless tobacco: Never  Substance and Sexual Activity   Alcohol use: No   Drug use: No   Sexual activity: Not on file  Other Topics Concern   Not on file  Social History Narrative   Lives in a group home. No information on family history available.   Social Determinants of Health   Financial Resource Strain: Not on file  Food Insecurity: Not on file  Transportation Needs: Not on file  Physical Activity: Not on file  Stress: Not on file  Social Connections: Not on file  Intimate Partner Violence: Not on file    No Known Allergies  History reviewed. No pertinent family history.  Prior to Admission medications   Medication Sig Start Date End Date Taking? Authorizing Provider  ARIPiprazole (ABILIFY) 10 MG tablet Take 10 mg by mouth at bedtime.   Yes [provider]  benztropine (COGENTIN) 1 MG tablet Take 1 mg by mouth 2 (two) times daily.   Yes [provider]  cetirizine (ZYRTEC) 10 MG tablet Take 10 mg by mouth at bedtime as needed for allergies.   Yes [provider]  fexofenadine (ALLEGRA) 180 MG tablet Take 180 mg by mouth daily as needed for allergies or rhinitis.   Yes [provider]  fluticasone (FLONASE) 50 MCG/ACT nasal spray Place 2 sprays into both nostrils at bedtime.   Yes [provider]  hydrOXYzine (ATARAX/VISTARIL) 50 MG tablet Take 50 mg by mouth 2 (two) times daily as needed for anxiety.   Yes [provider]  oxybutynin (DITROPAN) 5 MG tablet Take 5 mg by mouth 2 (two) times daily.   Yes [provider]    Physical  Exam: Vitals:   03/15/21 1500 03/15/21 1515 03/15/21 1530 03/15/21 1545  BP: (!) 130/95 (!) 138/93 137/88 (!) 137/94  Pulse: 81 85 82 73  Resp: 10 14 13 12   Temp:      TempSrc:      SpO2: 99% 98% 99% 100%     General:  Appears calm and comfortable and is in NAD. Sleeping, but awakens with light touch and voice then drifts back off to sleep.  Eyes:  PERRL, although more pinpoint, EOMI, normal lids, iris ENT:  grossly normal hearing, lips & tongue, mmm; appropriate dentition Neck:  no LAD, masses or thyromegaly; no carotid bruits Cardiovascular:  RRR, no m/r/g. No LE edema.  Respiratory:   CTA bilaterally with no wheezes/rales/rhonchi.  Normal respiratory effort. Abdomen:  soft, NT, ND, NABS Back:   normal alignment, no CVAT Skin:  no rash or induration seen on limited exam Musculoskeletal:  grossly normal tone BUE/BLE, ROM not examined. no bony abnormality Lower extremity:  No LE edema.  Limited foot exam with no ulcerations.  2+ distal pulses. Psychiatric:  could not fully assess. Speech dysarthric. Alert to person and place.  Neurologic:  CN 2-12 grossly intact-limited exam. Gait deferred. Sensation intact. DTR 2+.   Radiological Exams on Admission: Independently reviewed - see discussion in A/P where applicable  EEG adult  Result Date: 03/15/2021 03/17/2021, MD     03/15/2021  2:23 PM Patient Name: Terry Schroeder MRN: Terry Schroeder Epilepsy Attending: [id number] Referring Physician/Provider: Charlsie Quest, NP Date: 03/15/2021 Duration: 24.02 mins Patient history: 65yo M with ams. EEG to evaluate for seizure Level of alertness:  lethargic AEDs during EEG study: None Technical aspects: This EEG study was done with scalp electrodes positioned according to the 10-20 International system of electrode placement. Electrical activity was acquired at a sampling rate of 500Hz  and reviewed with a high frequency filter of 70Hz  and a low frequency filter of 1Hz . EEG data were  recorded continuously and digitally stored. Description: No posterior dominant rhythm was seen. EEG showed continuous generalized polymorphic disorganized predominantly 5-9 Hz theta-alpha activity as well as intermittent generalized 2-3hz  delta slowing. Hyperventilation and photic stimulation were not performed.   ABNORMALITY - Continuous slow, generalized IMPRESSION: This study is suggestive of moderate diffuse encephalopathy, nonspecific etiology. No seizures or epileptiform discharges were seen throughout the recording. 65yo   CT HEAD CODE STROKE WO CONTRAST  Result Date: 03/15/2021 CLINICAL DATA:  Code stroke. Neuro deficit, acute, stroke suspected. Found unresponsive. EXAM: CT HEAD WITHOUT CONTRAST TECHNIQUE: Contiguous axial images were obtained from the base of the skull through the vertex without intravenous contrast. COMPARISON:  Head CT 05/13/2017 FINDINGS: Brain: There is no evidence of an acute infarct, intracranial hemorrhage, mass, midline shift, or extra-axial fluid collection. Mild cerebral atrophy is most notable in the parietal regions. Vascular: No hyperdense vessel. Skull: No fracture or suspicious osseous lesion. Sinuses/Orbits: No acute finding. Other:  None. ASPECTS (Alberta Stroke Program Early CT Score) - Ganglionic level infarction (caudate, lentiform nuclei, internal capsule, insula, M1-M3 cortex): 7 - Supraganglionic infarction (M4-M6 cortex): 3 Total score (0-10 with 10 being normal): 10 IMPRESSION: No evidence of acute intracranial abnormality.  ASPECTS of 10. These results were communicated to Dr. Selina Cooley at 1:24 pm on 03/15/2021 by text page via the [facility] messaging system. Electronically Signed   By: Sebastian Ache M.D.   On: 03/15/2021 13:24   CT ANGIO HEAD NECK W WO CM W PERF (CODE STROKE)  Result Date: 03/15/2021 CLINICAL DATA:  Unresponsive. Right facial droop. Concern for basilar artery occlusion. EXAM: CT ANGIOGRAPHY HEAD AND NECK CT PERFUSION BRAIN TECHNIQUE:  Multidetector CT imaging of the head and neck was performed using the standard protocol during bolus administration of intravenous contrast. Multiplanar CT image reconstructions and MIPs were obtained to evaluate the vascular anatomy. Carotid stenosis measurements (when applicable) are obtained utilizing NASCET criteria, using the distal internal carotid diameter as the denominator. Multiphase CT imaging of the brain was performed following IV bolus contrast injection. Subsequent parametric perfusion maps were calculated using RAPID software. CONTRAST:  OMNIPAQUE IOHEXOL 350 MG/ML SOLN COMPARISON:  None. FINDINGS: CTA NECK FINDINGS Aortic arch: Standard 3 vessel aortic arch with widely patent arch vessel origins. Right carotid system: Patent without evidence of stenosis, dissection, or significant atherosclerosis. Left carotid system: Patent without evidence of stenosis, dissection, or significant atherosclerosis. Vertebral arteries: Patent without evidence of stenosis, dissection, or significant atherosclerosis. Slightly dominant left vertebral artery. Skeleton: Multiple dental caries and periapical lucencies. Moderate lower cervical spondylosis. Other neck: No evidence of cervical lymphadenopathy or mass. Upper chest: Clear lung apices. Review of the MIP images confirms the above findings CTA HEAD FINDINGS Anterior circulation: The internal carotid arteries are widely patent from skull base to carotid termini. ACAs and MCAs are patent without evidence of a proximal branch occlusion or significant proximal stenosis. No aneurysm is identified. Posterior circulation: The intracranial vertebral arteries are widely patent to the basilar. The basilar artery is patent with minimal irregularity and no significant stenosis. There is a small left posterior communicating artery. Both PCAs are patent with a mild distal left P1 stenosis noted. No aneurysm is identified. Venous sinuses: As permitted by contrast timing,  patent. Anatomic variants: None. Review of the MIP images confirms the above findings CT Brain Perfusion Findings: ASPECTS: 10 CBF (<30%) Volume: 0 mL Perfusion (Tmax>6.0s) volume: 0 mL Mismatch Volume: 0 mL Infarction Location: None IMPRESSION: 1. No large vessel occlusion or flow-limiting proximal stenosis in the head or neck. 2. Negative CTP. These results were communicated to Dr. Selina Cooley at 1:42 pm on 03/15/2021 by text page via the [facility] messaging system. Electronically Signed   By: Sebastian Ache M.D.   On: 03/15/2021 13:42    EKG: Independently reviewed.  NSR with rate 71; nonspecific ST changes with no evidence of acute ischemia. LAFB. No significant change.    Labs on Admission: I have personally reviewed the available labs and imaging studies at the time of the admission.  Pertinent labs:  Unremarkable  Covid/flu negative    Assessment/Plan Principal Problem:   Acute encephalopathy -Patient presenting with encephalopathy as evidenced by his obtunded/unresponsiveness, unresponsive to 3mg  Narcan -Evaluation thus far unremarkable including CT head, CTA head/neck, EEG and labs. Urine studies pending -have added on b12/tsh for metabolic labs, ammonia/ethanol wnl.  -low suspicion for infection. Has no white count, normal lactic acid and no finding on exam. F/u on UA/urine culture,  CXR ordered. Follow fever curve/cbc -Suspect medication misadventure or polypharmacy likely associated with his Abilify and on multiple anticholinergic drugs. Has woken up more since his arrival to ED.  -UDS pending  -Based on unremarkable evaluation with current ability to protect his airway, will observe for now with IVF bolus and telemetry monitoring and w/u as per above.    Active Problems:   Schizophrenia (HCC)  Concern for polypharmacy contributing to altered mental state. On multiple anticholinergic drugs. hold meds and consult psychiatry    There is no height or weight on file to calculate BMI.  Level  of care: Telemetry Medical DVT prophylaxis:  Lovenox  Code Status:  Full - unable to verify  Family Communication: None present Disposition Plan:  The patient is from: group home   Anticipated d/c is [facility] home Patient placed in observation with anticipation of less than 2 midnight stay.  Requires hospitalization due to altered state for work-up and monitoring. not safe to return home today.   Patient is currently: stable  Consults called: psychiatry, neurology  Admission status:  observation   Dragon dictation used in completing this note.    Orland Mustard MD [facility]   How to contact the [facility] Attending or Consulting provider 7A - 7P or covering provider during after hours 7P -7A, for this patient?  Check the care team in [facility] and look for a) attending/consulting [facility] provider listed and b) the [facility] team listed Log into [email or web address] and use [place]'s universal password to access. If you do not have the password, please contact the hospital operator. Locate the [facility] provider you are looking for under [facility] and page to a number that you can be directly reached. If you still have difficulty reaching the provider, please page the [facility] (Director on Call) for the Hospitalists listed on amion for assistance.   03/15/2021, 4:24 PM

## 2021-03-15 NOTE — Code Documentation (Signed)
Stroke Response Nurse Documentation Code Documentation  Terry Schroeder is a 65 y.o. male arriving to [facility] ED via [facility] on 03-15-2021 with past medical hx of mentally challenged, schizophrenia. On No antithrombotic. Code stroke was activated by ED.   Patient from group home where he was LKW before bed last night and now complaining of decreased LOC and right facial droop .He has received 3 doses of narcan. His pupils are still pinpoint  Stroke team at the bedside on patient arrival. Labs drawn and patient cleared for CT by Dr. Stevie Kern. Patient to CT with team. NIHSS 11, see documentation for details and code stroke times. Patient with decreased LOC, disoriented, right facial droop, bilateral arm weakness, bilateral leg weakness, and dysarthria  on exam. The following imaging was completed:  CT, CTA head and neck, CTP. Patient is not a candidate for IV Thrombolytic due to being outside TPA window. Patient is not a candidate for IR due to No LVO on CTA.   Care/Plan: neuro checks and VS q 2 hours Stat EEG.   Bedside handoff with ED RN Alycia Rossetti.    Marcellina Millin  Stroke Response RN

## 2021-03-15 NOTE — ED Provider Notes (Signed)
[facility] EMERGENCY DEPARTMENT Provider Note   CSN: [id number] Arrival date & time: 03/15/21  1257  An emergency department physician performed an initial assessment on this suspected stroke patient at 65.  History Chief Complaint  Patient presents with   Altered Mental Status   Code Stroke    KIET GEER is a 65 y.o. male.  Presents to ER for altered mental status.  History obtained from EMS report and from group home director.  Patient was reportedly normal yesterday evening.  Staff noted patient to be a little bit more drowsy this morning but he was still able to eat breakfast and take his regular medication in the morning.  This afternoon he was noted to be unresponsive, very lethargic.  EMS reported pinpoint pupils and gave dose of Narcan with no response.  No other meds given.  Per review of chart has Abilify, Cogentin and Ativan listed.  Has history of schizophrenia, mentally challenged  HPI     Past Medical History:  Diagnosis Date   Mentally challenged    Nonspecific abnormal electrocardiogram (ECG) (EKG)    Schizophrenia (HCC)    Shortness of breath     Patient Active Problem List   Diagnosis Date Noted   Metabolic encephalopathy 05/13/2017   UTI (urinary tract infection) 05/13/2017   [facility]) 06/18/2013   Precordial pain 06/18/2013   Shortness of breath    Nonspecific abnormal electrocardiogram (ECG) (EKG)    Mentally challenged    Schizophrenia [facility])     Past Surgical History:  Procedure Laterality Date   LEFT HEART CATHETERIZATION WITH CORONARY ANGIOGRAM N/A 06/18/2013   Procedure: LEFT HEART CATHETERIZATION WITH CORONARY ANGIOGRAM;  Surgeon: Corky Crafts, MD;  Location: [facility] CATH LAB;  Service: Cardiovascular;  Laterality: N/A;       History reviewed. No pertinent family history.  Social History   Tobacco Use   Smoking status: Never   Smokeless tobacco: Never  Substance Use Topics   Alcohol use: No    Drug use: No    Home Medications Prior to Admission medications   Medication Sig Start Date End Date Taking? Authorizing Provider  ARIPiprazole (ABILIFY) 5 MG tablet Take 5 mg by mouth daily.    [provider]  benztropine (COGENTIN) 1 MG tablet Take 1 mg by mouth at bedtime.     [provider]  LORazepam (ATIVAN) 0.5 MG tablet Take 0.5 mg by mouth 2 (two) times daily.    [provider]    Allergies    Patient has no known allergies.  Review of Systems   Review of Systems  Unable to perform ROS: Acuity of condition   Physical Exam Updated Vital Signs BP (!) 139/93   Pulse 99   Temp (!) 97.5 F (36.4 C) (Axillary)   Resp 15   SpO2 98%   Physical Exam Vitals and nursing note reviewed.  Constitutional:      General: He is not in acute distress.    Appearance: He is well-developed.  HENT:     Head: Normocephalic and atraumatic.  Eyes:     Conjunctiva/sclera: Conjunctivae normal.  Cardiovascular:     Rate and Rhythm: Normal rate and regular rhythm.     Heart sounds: No murmur heard. Pulmonary:     Effort: Pulmonary effort is normal. No respiratory distress.     Breath sounds: Normal breath sounds.  Abdominal:     Palpations: Abdomen is soft.     Tenderness: There  is no abdominal tenderness.  Musculoskeletal:        General: No swelling.     Cervical back: Neck supple.  Skin:    General: Skin is warm and dry.     Capillary Refill: Capillary refill takes less than 2 seconds.  Neurological:     Mental Status: He is alert.     GCS: GCS eye subscore is 2. GCS verbal subscore is 4. GCS motor subscore is 5.     Comments: Mild right facial droop, patient localizes pain in all 4 extremities, confused verbal response, opens eyes to painful stimuli; gag reflex intact    ED Results / Procedures / Treatments   Labs (all labs ordered are listed, but only abnormal results are displayed) Labs Reviewed  CBC - Abnormal; Notable for the following  components:      Result Value   RBC 5.83 (*)    MCV 70.7 (*)    MCH 22.6 (*)    All other components within normal limits  COMPREHENSIVE METABOLIC PANEL - Abnormal; Notable for the following components:   Glucose, Bld 103 (*)    Creatinine, Ser 1.25 (*)    All other components within normal limits  I-STAT CHEM 8, ED - Abnormal; Notable for the following components:   Glucose, Bld 104 (*)    Calcium, Ion 1.00 (*)    All other components within normal limits  I-STAT ARTERIAL BLOOD GAS, ED - Abnormal; Notable for the following components:   pH, Arterial 7.484 (*)    pO2, Arterial 165 (*)    Acid-Base Excess 3.0 (*)    All other components within normal limits  RESP PANEL BY RT-PCR (FLU A&B, COVID) ARPGX2  ETHANOL  PROTIME-INR  DIFFERENTIAL  LACTIC ACID, PLASMA  AMMONIA  RAPID URINE DRUG SCREEN, HOSP PERFORMED  URINALYSIS, ROUTINE W REFLEX MICROSCOPIC  LACTIC ACID, PLASMA  CBG MONITORING, ED  I-STAT CHEM 8, ED  TROPONIN I (HIGH SENSITIVITY)  TROPONIN I (HIGH SENSITIVITY)    EKG None  Radiology EEG adult  Result Date: 03/15/2021 Charlsie Quest, MD     03/15/2021  2:23 PM Patient Name: TYRION GLAUDE MRN: [id number] Epilepsy Attending: Charlsie Quest Referring Physician/Provider: Kara Mead, NP Date: 03/15/2021 Duration: 24.02 mins Patient history: 65yo M with ams. EEG to evaluate for seizure Level of alertness:  lethargic AEDs during EEG study: None Technical aspects: This EEG study was done with scalp electrodes positioned according to the 10-20 International system of electrode placement. Electrical activity was acquired at a sampling rate of 500Hz  and reviewed with a high frequency filter of 70Hz  and a low frequency filter of 1Hz . EEG data were recorded continuously and digitally stored. Description: No posterior dominant rhythm was seen. EEG showed continuous generalized polymorphic disorganized predominantly 5-9 Hz theta-alpha activity as well as intermittent  generalized 2-3hz  delta slowing. Hyperventilation and photic stimulation were not performed.   ABNORMALITY - Continuous slow, generalized IMPRESSION: This study is suggestive of moderate diffuse encephalopathy, nonspecific etiology. No seizures or epileptiform discharges were seen throughout the recording.   CT HEAD CODE STROKE WO CONTRAST  Result Date: 03/15/2021 CLINICAL DATA:  Code stroke. Neuro deficit, acute, stroke suspected. Found unresponsive. EXAM: CT HEAD WITHOUT CONTRAST TECHNIQUE: Contiguous axial images were obtained from the base of the skull through the vertex without intravenous contrast. COMPARISON:  Head CT 05/13/2017 FINDINGS: Brain: There is no evidence of an acute infarct, intracranial hemorrhage, mass, midline shift, or extra-axial fluid collection. Mild  cerebral atrophy is most notable in the parietal regions. Vascular: No hyperdense vessel. Skull: No fracture or suspicious osseous lesion. Sinuses/Orbits: No acute finding. Other: None. ASPECTS [facility] Stroke Program Early CT Score) - Ganglionic level infarction (caudate, lentiform nuclei, internal capsule, insula, M1-M3 cortex): 7 - Supraganglionic infarction (M4-M6 cortex): 3 Total score (0-10 with 10 being normal): 10 IMPRESSION: No evidence of acute intracranial abnormality.  ASPECTS of 10. These results were communicated to Dr. Selina Cooley at 1:24 pm on 03/15/2021 by text page via the [facility] messaging system. Electronically Signed   By: Sebastian Ache M.D.   On: 03/15/2021 13:24   CT ANGIO HEAD NECK W WO CM W PERF (CODE STROKE)  Result Date: 03/15/2021 CLINICAL DATA:  Unresponsive. Right facial droop. Concern for basilar artery occlusion. EXAM: CT ANGIOGRAPHY HEAD AND NECK CT PERFUSION BRAIN TECHNIQUE: Multidetector CT imaging of the head and neck was performed using the standard protocol during bolus administration of intravenous contrast. Multiplanar CT image reconstructions and MIPs were obtained to evaluate the  vascular anatomy. Carotid stenosis measurements (when applicable) are obtained utilizing NASCET criteria, using the distal internal carotid diameter as the denominator. Multiphase CT imaging of the brain was performed following IV bolus contrast injection. Subsequent parametric perfusion maps were calculated using RAPID software. CONTRAST:  OMNIPAQUE IOHEXOL 350 MG/ML SOLN COMPARISON:  None. FINDINGS: CTA NECK FINDINGS Aortic arch: Standard 3 vessel aortic arch with widely patent arch vessel origins. Right carotid system: Patent without evidence of stenosis, dissection, or significant atherosclerosis. Left carotid system: Patent without evidence of stenosis, dissection, or significant atherosclerosis. Vertebral arteries: Patent without evidence of stenosis, dissection, or significant atherosclerosis. Slightly dominant left vertebral artery. Skeleton: Multiple dental caries and periapical lucencies. Moderate lower cervical spondylosis. Other neck: No evidence of cervical lymphadenopathy or mass. Upper chest: Clear lung apices. Review of the MIP images confirms the above findings CTA HEAD FINDINGS Anterior circulation: The internal carotid arteries are widely patent from skull base to carotid termini. ACAs and MCAs are patent without evidence of a proximal branch occlusion or significant proximal stenosis. No aneurysm is identified. Posterior circulation: The intracranial vertebral arteries are widely patent to the basilar. The basilar artery is patent with minimal irregularity and no significant stenosis. There is a small left posterior communicating artery. Both PCAs are patent with a mild distal left P1 stenosis noted. No aneurysm is identified. Venous sinuses: As permitted by contrast timing, patent. Anatomic variants: None. Review of the MIP images confirms the above findings CT Brain Perfusion Findings: ASPECTS: 10 CBF (<30%) Volume: 0 mL Perfusion (Tmax>6.0s) volume: 0 mL Mismatch Volume: 0 mL Infarction  Location: None IMPRESSION: 1. No large vessel occlusion or flow-limiting proximal stenosis in the head or neck. 2. Negative CTP. These results were communicated to Dr. Selina Cooley at 1:42 pm on 03/15/2021 by text page via the [facility] messaging system. Electronically Signed   By: Sebastian Ache M.D.   On: 03/15/2021 13:42    Procedures Procedures   Medications Ordered in ED Medications  naloxone [facility]) injection 2 mg (2 mg Intravenous Given 03/15/21 1305)  iohexol (OMNIPAQUE) 350 MG/ML injection 100 mL (100 mLs Intravenous Contrast Given 03/15/21 1328)  naloxone (NARCAN) injection 2 mg (2 mg Intravenous Given 03/15/21 1406)    ED Course  I have reviewed the triage vital signs and the nursing notes.  Pertinent labs & imaging results that were available during my care of the patient were reviewed by me and considered in my medical decision making (see chart for  details).    MDM Rules/Calculators/A&P                           65 year old gentleman presented to ER due to altered mental status, unresponsive state.  On arrival to ER patient was aroused with sternal rub, he was localizing pain in all 4 extremities but seem to have a right facial droop, given the sudden onset change in mental status and possible neuro finding, initiated code stroke.  CBG normal.  CT head and CTA head and neck negative.  Neurology arranged for stat EEG which was negative.  Broad work-up for metabolic, medical cause was obtained.  No clear etiology at present.  Patient did have similar presentation in 2019 that was attributed ultimately to polypharmacy, his home medications.  While in ER his mental status does seem to be improving.  Still has not returned to baseline, believe he would benefit from admission for further observation.  Placed consult to medicine for admit.  Final Clinical Impression(s) / ED Diagnoses Final diagnoses:  Altered mental status, unspecified altered mental status type    Rx / DC Orders ED  Discharge Orders     None        Milagros Loll, MD 03/15/21 1515

## 2021-03-15 NOTE — Progress Notes (Signed)
EEG complete - results pending 

## 2021-03-15 NOTE — Procedures (Signed)
Patient Name: Terry Schroeder  MRN: [id number]  Epilepsy Attending: Charlsie Quest  Referring Physician/Provider: Kara Mead, NP Date: 03/15/2021 Duration: 24.02 mins  Patient history: 65yo M with ams. EEG to evaluate for seizure  Level of alertness:  lethargic   AEDs during EEG study: None  Technical aspects: This EEG study was done with scalp electrodes positioned according to the 10-20 International system of electrode placement. Electrical activity was acquired at a sampling rate of 500Hz  and reviewed with a high frequency filter of 70Hz  and a low frequency filter of 1Hz . EEG data were recorded continuously and digitally stored.   Description: No posterior dominant rhythm was seen. EEG showed continuous generalized polymorphic disorganized predominantly 5-9 Hz theta-alpha activity as well as intermittent generalized 2-3hz  delta slowing. Hyperventilation and photic stimulation were not performed.     ABNORMALITY - Continuous slow, generalized  IMPRESSION: This study is suggestive of moderate diffuse encephalopathy, nonspecific etiology. No seizures or epileptiform discharges were seen throughout the recording.  [name] 

## 2021-03-16 DIAGNOSIS — G9340 Encephalopathy, unspecified: Secondary | ICD-10-CM

## 2021-03-16 DIAGNOSIS — F209 Schizophrenia, unspecified: Secondary | ICD-10-CM | POA: Diagnosis not present

## 2021-03-16 DIAGNOSIS — R4189 Other symptoms and signs involving cognitive functions and awareness: Secondary | ICD-10-CM | POA: Diagnosis not present

## 2021-03-16 DIAGNOSIS — Z20822 Contact with and (suspected) exposure to covid-19: Secondary | ICD-10-CM | POA: Diagnosis not present

## 2021-03-16 LAB — CBC
HCT: 43.2 % (ref 39.0–52.0)
Hemoglobin: 13.9 g/dL (ref 13.0–17.0)
MCH: 22.4 pg — ABNORMAL LOW (ref 26.0–34.0)
MCHC: 32.2 g/dL (ref 30.0–36.0)
MCV: 69.6 fL — ABNORMAL LOW (ref 80.0–100.0)
Platelets: 223 10*3/uL (ref 150–400)
RBC: 6.21 MIL/uL — ABNORMAL HIGH (ref 4.22–5.81)
RDW: 14.4 % (ref 11.5–15.5)
WBC: 8.7 10*3/uL (ref 4.0–10.5)
nRBC: 0 % (ref 0.0–0.2)

## 2021-03-16 LAB — BASIC METABOLIC PANEL
Anion gap: 9 (ref 5–15)
BUN: 12 mg/dL (ref 8–23)
CO2: 27 mmol/L (ref 22–32)
Calcium: 9.2 mg/dL (ref 8.9–10.3)
Chloride: 104 mmol/L (ref 98–111)
Creatinine, Ser: 1.08 mg/dL (ref 0.61–1.24)
GFR, Estimated: >60 mL/min (ref >60)
Glucose, Bld: 114 mg/dL — ABNORMAL HIGH (ref 70–99)
Potassium: 3.6 mmol/L (ref 3.5–5.1)
Sodium: 140 mmol/L (ref 135–145)

## 2021-03-16 LAB — TSH: TSH: 0.624 u[IU]/mL (ref 0.350–4.500)

## 2021-03-16 LAB — MAGNESIUM: Magnesium: 1.8 mg/dL (ref 1.7–2.4)

## 2021-03-16 LAB — TROPONIN I (HIGH SENSITIVITY)
Troponin I (High Sensitivity): 4 ng/L (ref <18)
Troponin I (High Sensitivity): 5 ng/L (ref <18)
Troponin I (High Sensitivity): 4 ng/L (ref <18)

## 2021-03-16 LAB — URINE CULTURE: Culture: <10000 — AB

## 2021-03-16 LAB — GLUCOSE, CAPILLARY: Glucose-Capillary: 114 mg/dL — ABNORMAL HIGH (ref 70–99)

## 2021-03-16 MED ORDER — ATROPINE SULFATE 1 MG/10ML IJ SOSY
PREFILLED_SYRINGE | INTRAMUSCULAR | Status: AC
  Filled 2021-03-16: qty 10

## 2021-03-16 MED ORDER — ARIPIPRAZOLE 10 MG PO TABS
10.0000 mg | ORAL_TABLET | Freq: Every day | ORAL | Status: DC
  Administered 2021-03-16: 10 mg via ORAL
  Filled 2021-03-16: qty 1

## 2021-03-16 NOTE — Progress Notes (Addendum)
PROGRESS NOTE  Terry Schroeder ZOX:[id number] DOB: 07-May-1955 DOA: 03/15/2021 PCP: Pcp, No   LOS: 0 days   Brief narrative:  Terry Schroeder is a 65 y.o. male with past medical history of mental retardation, schizophrenia presented to hospital with altered mental status and difficulty waking up.  Patient is from group home.  At the group home patient was more lethargic and was unresponsive even on sternal rub.  EMS was called in and was given Narcan without improvement in his mental status.  Vital ambulatory unremarkable including labs.  CT head scan did not show any acute abnormality.  CTA head and neck without large vessel occlusion.  EEG showed moderate diffuse encephalopathy, nonspecific etiology.  No seizures or epileptiform discharges were seen.  In the ED, code stroke was called initially.  Patient was then admitted to hospital for further evaluation and treatment.  Assessment/Plan:  Principal Problem:   Acute encephalopathy Active Problems:   Schizophrenia (HCC)   Unresponsive  Acute metabolic encephalopathy Likely secondary to medication effect.  Anticholinergic medications on hold.  CT head CTA head neck EEG unremarkable.  Labs within normal limits.  No signs of infection at this time.  Urine drug screen was negative.  Schizophrenia   Anticholinergic medications on hold due to altered mental status.Marland Kitchen  Psychiatry has been consulted for further evaluation and medication adjustment as necessary.    Disposition.  Patient is currently at group home.    DVT prophylaxis: enoxaparin (LOVENOX) injection 40 mg Start: 03/15/21 1715   Code Status: Full code  Family Communication: Spoke with the patient at bedside  Status is: Observation  The patient will require care spanning > 2 midnights and should be moved to inpatient because: Altered mental status, need for psych evaluation and medication  adjustment,  Consultants: Neurology Psychiatry.  Procedures: EEG  Anti-infectives:  None  Anti-infectives (From admission, onward)    None      Subjective: Today, patient was seen and examined at bedside.  Patient feels little better today.  Eating this morning.  Denies any headache nausea vomiting or abdominal pain.   Objective: Vitals:   03/16/21 0540 03/16/21 1013  BP: 113/80   Pulse: 64   Resp: 16 19  Temp: 97.9 F (36.6 C)   SpO2: 99%     Intake/Output Summary (Last 24 hours) at 03/16/2021 1307 Last data filed at 03/16/2021 0300 Gross per 24 hour  Intake 500.36 ml  Output --  Net 500.36 ml   Filed Weights   03/15/21 1700  Weight: 70 kg   Body mass index is 24.91 kg/m.   Physical Exam:  GENERAL: Patient is alert awake oriented x2.  Communicative. Not in obvious distress. HENT: No scleral pallor or icterus. Pupils equally reactive to light. Oral mucosa is moist NECK: is supple, no gross swelling noted. CHEST: Clear to auscultation. No crackles or wheezes.  Diminished breath sounds bilaterally. CVS: S1 and S2 heard, no murmur. Regular rate and rhythm.  ABDOMEN: Soft, non-tender, bowel sounds are present. EXTREMITIES: No edema. CNS: Dysarthric speech, moves all extremities. SKIN: warm and dry without rashes.  Data Review: I have personally reviewed the following laboratory data and studies,  CBC: Recent Labs  Lab 03/15/21 1303 03/15/21 1312 03/15/21 1314 03/16/21 0147  WBC 7.4  --   --  8.7  NEUTROABS 6.2  --   --   --   HGB 13.2 13.9 14.6 13.9  HCT 41.2 41.0 43.0 43.2  MCV 70.7*  --   --  69.6*  PLT 226  --   --  223   Basic Metabolic Panel: Recent Labs  Lab 03/15/21 1303 03/15/21 1312 03/15/21 1314 03/16/21 0147  NA 137 137 139 140  K 4.0 3.7 4.1 3.6  CL 105  --  106 104  CO2 25  --   --  27  GLUCOSE 103*  --  104* 114*  BUN 11  --  16 12  CREATININE 1.25*  --  1.20 1.08  CALCIUM 9.1  --   --  9.2  MG  --   --   --  1.8    Liver Function Tests: Recent Labs  Lab 03/15/21 1303  AST 28  ALT 29  ALKPHOS 65  BILITOT 0.7  PROT 7.1  ALBUMIN 3.8   No results for input(s): LIPASE, AMYLASE in the last 168 hours. Recent Labs  Lab 03/15/21 1259  AMMONIA 28   Cardiac Enzymes: No results for input(s): CKTOTAL, CKMB, CKMBINDEX, TROPONINI in the last 168 hours. BNP (last 3 results) No results for input(s): BNP in the last 8760 hours.  ProBNP (last 3 results) No results for input(s): PROBNP in the last 8760 hours.  CBG: Recent Labs  Lab 03/15/21 1259  GLUCAP 96   Recent Results (from the past 240 hour(s))  Resp Panel by RT-PCR (Flu A&B, Covid) Nasopharyngeal Swab     Status: None   Collection Time: 03/15/21  1:03 PM   Specimen: Nasopharyngeal Swab; Nasopharyngeal(NP) swabs in vial transport medium  Result Value Ref Range Status   SARS Coronavirus 2 by RT PCR NEGATIVE NEGATIVE Final    Comment: (NOTE) SARS-CoV-2 target nucleic acids are NOT DETECTED.  The SARS-CoV-2 RNA is generally detectable in upper respiratory specimens during the acute phase of infection. The lowest concentration of SARS-CoV-2 viral copies this assay can detect is 138 copies/mL. A negative result does not preclude SARS-Cov-2 infection and should not be used as the sole basis for treatment or other patient management decisions. A negative result may occur with  improper specimen collection/handling, submission of specimen other than nasopharyngeal swab, presence of viral mutation(s) within the areas targeted by this assay, and inadequate number of viral copies(<138 copies/mL). A negative result must be combined with clinical observations, patient history, and epidemiological information. The expected result is Negative.  Fact Sheet for Patients:  BloggerCourse.com  Fact Sheet for Healthcare Providers:  SeriousBroker.it  This test is no t yet approved or cleared by the  [place] FDA and  has been authorized for detection and/or diagnosis of SARS-CoV-2 by FDA under an Emergency Use Authorization (EUA). This EUA will remain  in effect (meaning this test can be used) for the duration of the COVID-19 declaration under Section 564(b)(1) of the Act, 21 U.S.C.section 360bbb-3(b)(1), unless the authorization is terminated  or revoked sooner.       Influenza A by PCR NEGATIVE NEGATIVE Final   Influenza B by PCR NEGATIVE NEGATIVE Final    Comment: (NOTE) The Xpert Xpress SARS-CoV-2/FLU/RSV plus assay is intended as an aid in the diagnosis of influenza from Nasopharyngeal swab specimens and should not be used as a sole basis for treatment. Nasal washings and aspirates are unacceptable for Xpert Xpress SARS-CoV-2/FLU/RSV testing.  Fact Sheet for Patients: BloggerCourse.com  Fact Sheet for Healthcare Providers: SeriousBroker.it  This test is not yet approved or cleared by the [place] FDA and has been authorized for detection and/or diagnosis of SARS-CoV-2 by FDA under an Emergency Use Authorization (EUA). This EUA will  remain in effect (meaning this test can be used) for the duration of the COVID-19 declaration under Section 564(b)(1) of the Act, 21 U.S.C. section 360bbb-3(b)(1), unless the authorization is terminated or revoked.  Performed at [facility], [address], [place], [place] [address]      Studies: DG CHEST PORT 1 VIEW  Result Date: 03/15/2021 CLINICAL DATA:  Patient found unresponsive. EXAM: PORTABLE CHEST 1 VIEW COMPARISON:  May 13, 2017 FINDINGS: The heart size and mediastinal contours are within normal limits. Both lungs are clear. The visualized skeletal structures are unremarkable. IMPRESSION: No active disease. Electronically Signed   By: Gerome Sam III M.D.   On: 03/15/2021 17:15   EEG adult  Result Date: 03/15/2021 Charlsie Quest, MD     03/15/2021   2:23 PM Patient Name: DENO SIDA MRN: [id number] Epilepsy Attending: Charlsie Quest Referring Physician/Provider: Kara Mead, NP Date: 03/15/2021 Duration: 24.02 mins Patient history: 65yo M with ams. EEG to evaluate for seizure Level of alertness:  lethargic AEDs during EEG study: None Technical aspects: This EEG study was done with scalp electrodes positioned according to the 10-20 International system of electrode placement. Electrical activity was acquired at a sampling rate of 500Hz  and reviewed with a high frequency filter of 70Hz  and a low frequency filter of 1Hz . EEG data were recorded continuously and digitally stored. Description: No posterior dominant rhythm was seen. EEG showed continuous generalized polymorphic disorganized predominantly 5-9 Hz theta-alpha activity as well as intermittent generalized 2-3hz  delta slowing. Hyperventilation and photic stimulation were not performed.   ABNORMALITY - Continuous slow, generalized IMPRESSION: This study is suggestive of moderate diffuse encephalopathy, nonspecific etiology. No seizures or epileptiform discharges were seen throughout the recording.   CT HEAD CODE STROKE WO CONTRAST  Result Date: 03/15/2021 CLINICAL DATA:  Code stroke. Neuro deficit, acute, stroke suspected. Found unresponsive. EXAM: CT HEAD WITHOUT CONTRAST TECHNIQUE: Contiguous axial images were obtained from the base of the skull through the vertex without intravenous contrast. COMPARISON:  Head CT 05/13/2017 FINDINGS: Brain: There is no evidence of an acute infarct, intracranial hemorrhage, mass, midline shift, or extra-axial fluid collection. Mild cerebral atrophy is most notable in the parietal regions. Vascular: No hyperdense vessel. Skull: No fracture or suspicious osseous lesion. Sinuses/Orbits: No acute finding. Other: None. ASPECTS [facility] Stroke Program Early CT Score) - Ganglionic level infarction (caudate, lentiform nuclei, internal capsule,  insula, M1-M3 cortex): 7 - Supraganglionic infarction (M4-M6 cortex): 3 Total score (0-10 with 10 being normal): 10 IMPRESSION: No evidence of acute intracranial abnormality.  ASPECTS of 10. These results were communicated to Dr. 03/17/2021 at 1:24 pm on 03/15/2021 by text page via the [facility] messaging system. Electronically Signed   By: Selina Cooley M.D.   On: 03/15/2021 13:24   CT ANGIO HEAD NECK W WO CM W PERF (CODE STROKE)  Result Date: 03/15/2021 CLINICAL DATA:  Unresponsive. Right facial droop. Concern for basilar artery occlusion. EXAM: CT ANGIOGRAPHY HEAD AND NECK CT PERFUSION BRAIN TECHNIQUE: Multidetector CT imaging of the head and neck was performed using the standard protocol during bolus administration of intravenous contrast. Multiplanar CT image reconstructions and MIPs were obtained to evaluate the vascular anatomy. Carotid stenosis measurements (when applicable) are obtained utilizing NASCET criteria, using the distal internal carotid diameter as the denominator. Multiphase CT imaging of the brain was performed following IV bolus contrast injection. Subsequent parametric perfusion maps were calculated using RAPID software. CONTRAST:  Sebastian Ache OMNIPAQUE IOHEXOL 350 MG/ML SOLN  COMPARISON:  None. FINDINGS: CTA NECK FINDINGS Aortic arch: Standard 3 vessel aortic arch with widely patent arch vessel origins. Right carotid system: Patent without evidence of stenosis, dissection, or significant atherosclerosis. Left carotid system: Patent without evidence of stenosis, dissection, or significant atherosclerosis. Vertebral arteries: Patent without evidence of stenosis, dissection, or significant atherosclerosis. Slightly dominant left vertebral artery. Skeleton: Multiple dental caries and periapical lucencies. Moderate lower cervical spondylosis. Other neck: No evidence of cervical lymphadenopathy or mass. Upper chest: Clear lung apices. Review of the MIP images confirms the above findings CTA HEAD FINDINGS  Anterior circulation: The internal carotid arteries are widely patent from skull base to carotid termini. ACAs and MCAs are patent without evidence of a proximal branch occlusion or significant proximal stenosis. No aneurysm is identified. Posterior circulation: The intracranial vertebral arteries are widely patent to the basilar. The basilar artery is patent with minimal irregularity and no significant stenosis. There is a small left posterior communicating artery. Both PCAs are patent with a mild distal left P1 stenosis noted. No aneurysm is identified. Venous sinuses: As permitted by contrast timing, patent. Anatomic variants: None. Review of the MIP images confirms the above findings CT Brain Perfusion Findings: ASPECTS: 10 CBF (<30%) Volume: 0 mL Perfusion (Tmax>6.0s) volume: 0 mL Mismatch Volume: 0 mL Infarction Location: None IMPRESSION: 1. No large vessel occlusion or flow-limiting proximal stenosis in the head or neck. 2. Negative CTP. These results were communicated to Dr. Selina Cooley at 1:42 pm on 03/15/2021 by text page via the [facility] messaging system. Electronically Signed   By: Sebastian Ache M.D.   On: 03/15/2021 13:42      Joycelyn Das, MD  [facility] 03/16/2021  If 7PM-7AM, please contact night-coverage

## 2021-03-16 NOTE — Significant Event (Signed)
Rapid Response Event Note   Reason for Call :  HR-30s, SBP-60s.  Per RN, pt dropped HR to 30s. Upon entering room, pt was sleeping. RN did sternal rub to awaken pt and he began to vomit.   Initial Focused Assessment:  Pt initially lying in bed with eyes closed, with very slow, shallow respirations, pulse was present. Pt responsive to painful stimulation only. During assessment, pt began to wake up more. He could answer some questions and move all extremities(per RN, this is his baseline). His HR increased to 50s and SBP increased to 130s.   T-97.9, HR-55, BP-134/105, RR-12, SpO2-100% on RA,  Interventions:  EKG-SB, R BBB BMP, Mg, Trop Atropine at bedside Plan of Care:  HR up to 57, SBP-130s, and pt back to beside mental status. Await lab results. Atropine at bedside in case pt experiences symptomatic bradycardia again. Continue to monitor pt closely. Call RRT if further assistance needed.    Event Summary:   MD Notified: Chotiner notified and came to bedside Call Time:0025 Arrival Time:0027 End Time:0105  Terrilyn Saver, RN

## 2021-03-16 NOTE — Progress Notes (Signed)
Notified by RN that HR dropped into 30s. Pt was asleep.  EKG shows sinus bradycardia.  Sternal rub performed and pt woke up and vomited once. HR improved to 55.  Pt alert when I entered room and denied any pain. He then fell asleep.  He is hemodynamically stable. O2 sat 99-100% on RA   Check BMP, Magnesium, troponin level Atropine at bedside.  Continue to monitor closely.

## 2021-03-16 NOTE — Care Management Obs Status (Signed)
MEDICARE OBSERVATION STATUS NOTIFICATION   Patient Details  Name: Terry Schroeder MRN: [id number] Date of Birth: 10/08/1955   Medicare Observation Status Notification Given:  Yes    Kermit Balo, RN 03/16/2021, 4:55 PM

## 2021-03-16 NOTE — Progress Notes (Addendum)
Neurology Progress Note   S:// Patient is sitting up in bed in NAD. He responds to voice, however drifts quickly back to sleep requiring multiple attempts to wake up. He is asking for breakfast. Unable to give me a reliable account of events. He is lethargic, able to answer questions appropriately. Was able to tell me his name and the place, age was wrong, able to moving all extremities. VS on monitor are stable. RRT was called on patient during the night due to bradycardia while sleeping. No family at bedside. RN at bedside while rounding, reports no new neurological events.    O:// Current vital signs: BP 113/80 (BP Location: Left Arm)   Pulse 64   Temp 97.9 F (36.6 C) (Oral)   Resp 16   Ht 5\' 6"  (1.676 m)   Wt 70 kg   SpO2 99%   BMI 24.91 kg/m  Vital signs in last 24 hours: Temp:  [97.5 F (36.4 C)-98.9 F (37.2 C)] 97.9 F (36.6 C) (11/25 0540) Pulse Rate:  [32-99] 64 (11/25 0540) Resp:  [10-23] 16 (11/25 0540) BP: (113-158)/(79-105) 113/80 (11/25 0540) SpO2:  [97 %-100 %] 99 % (11/25 0540) Weight:  [70 kg] 70 kg (11/24 1700)  GENERAL: lethargic in NAD HEENT: - Normocephalic and atraumatic, dry mm LUNGS - Clear to auscultation bilaterally with no wheezes CV - S1S2 RRR, no m/r/g, equal pulses bilaterally. ABDOMEN - Soft, nontender, nondistended with normoactive BS Ext: warm, well perfused, intact peripheral pulses, no edema  NEURO:  Mental Status: AA&Ox2. Able to state name and place. Limited attention span requiring constant reinforcing to stay awake. Pupils equal and reactive. Speech is mild dsyarthric, can name objects. Not able to give a coherent history Motor: Moves all 4 extremities antigravity.  Tone: is normal and bulk is normal Sensation- Intact to light touch bilaterally Coordination: FTN intact bilaterally, no ataxia in BLE. Gait- deferred  Medications  Current Facility-Administered Medications:    0.9 %  sodium chloride infusion, 250 mL, Intravenous,  PRN, 06-[birth year]-1977, MD   atropine 1 MG/10ML injection, , , ,    enoxaparin (LOVENOX) injection 40 mg, 40 mg, Subcutaneous, Q24H, Orland Mustard, MD, 40 mg at 03/15/21 2138   fluticasone (FLONASE) 50 MCG/ACT nasal spray 2 spray, 2 spray, Each Nare, 2139, MD, 2 spray at 03/15/21 2138   sodium chloride flush (NS) 0.9 % injection 3 mL, 3 mL, Intravenous, Q12H, 2139, MD, 3 mL at 03/15/21 2138   sodium chloride flush (NS) 0.9 % injection 3 mL, 3 mL, Intravenous, PRN, 2139, MD Labs CBC    Component Value Date/Time   WBC 8.7 03/16/2021 0147   RBC 6.21 (H) 03/16/2021 0147   HGB 13.9 03/16/2021 0147   HCT 43.2 03/16/2021 0147   PLT 223 03/16/2021 0147   MCV 69.6 (L) 03/16/2021 0147   MCH 22.4 (L) 03/16/2021 0147   MCHC 32.2 03/16/2021 0147   RDW 14.4 03/16/2021 0147   LYMPHSABS 0.8 03/15/2021 1303   MONOABS 0.3 03/15/2021 1303   EOSABS 0.0 03/15/2021 1303   BASOSABS 0.0 03/15/2021 1303    CMP     Component Value Date/Time   NA 140 03/16/2021 0147   K 3.6 03/16/2021 0147   CL 104 03/16/2021 0147   CO2 27 03/16/2021 0147   GLUCOSE 114 (H) 03/16/2021 0147   BUN 12 03/16/2021 0147   CREATININE 1.08 03/16/2021 0147   CALCIUM 9.2 03/16/2021 0147   PROT 7.1 03/15/2021 1303   ALBUMIN 3.8  03/15/2021 1303   AST 28 03/15/2021 1303   ALT 29 03/15/2021 1303   ALKPHOS 65 03/15/2021 1303   BILITOT 0.7 03/15/2021 1303   GFRNONAA >60 03/16/2021 0147   GFRAA >60 05/15/2017 0703    glycosylated hemoglobin  Lipid Panel     Component Value Date/Time   CHOL 110 06/19/2013 0117   TRIG 40 06/19/2013 0117   HDL 62 06/19/2013 0117   CHOLHDL 1.8 06/19/2013 0117   VLDL 8 06/19/2013 0117   LDLCALC 40 06/19/2013 0117     Imaging I have reviewed images in epic and the results pertinent to this consultation are:  CT-scan of the brain no acute abnormality  CTA head/neck NO LVO   Assessment:   Terry Schroeder is a 65 y.o. male with medical history  significant of MR, schizophrenia, lives at a group home presented  to ED secondary to AMS. Per group home director, patient was at his normal baseline last night around dinner. When he came to breakfast he seemed more lethargic. Later in day became unresponsive except to painful stimuli. He was slumped over in chair. EMS was called. Given 3mg  of Narcan without improvement in his mental status. He also initially had a slight right mouth droop and code stroke was activated.   Impression: Toxic Encephalopathy potentially due to misuse of home meds EEG with generalized slowing Infectious workup is negative thus far;  - CXR negative  -UA negative, Urine Cx pending  -UDS negative  -Lactic acid 1.6 -Resp panel negative  -Ammonia level 28 -Ethanol negative   Recommendations: Patient appears to have improved mental status since admission and no focal neurological deficits and infectious workup is negative.  Suspect encephalopathy is due to his misuse of home medications. Neurology will sign off. Please call for questions or concerns   , NP   Attending Neurohospitalist Addendum Agree with the history and physical as documented above. Agree with the plan as documented, which I helped formulate. Suspect medication misuse or accidental overuse, patient returning to baseline. No further inpatient neurologic workup indicated. Neurology to sign off but please re-engage if new questions arise.  Gevena Mart, MD [facility] [phone]  If 7pm- 7am, please page neurology on call as listed in AMION.

## 2021-03-16 NOTE — TOC Initial Note (Signed)
Transition of Care [facility]) - Initial/Assessment Note    Patient Details  Name: Terry Schroeder MRN: [id number] Date of Birth: October 19, 1955  Transition of Care [facility] CM/SW Contact:    Kermit Balo, RN Phone Number: 03/16/2021, 4:56 PM  Clinical Narrative:                 Pt with confusion. CM spoke to Ms Rockney Ghee, Group home administrator, over the phone. Plan is for him to return to the group home once medically ready. She just wants to make sure he is ready to return. ToC following.  Expected Discharge Plan: Home/Self Care Barriers to Discharge: Continued Medical Work up   Patient Goals and CMS Choice        Expected Discharge Plan and Services Expected Discharge Plan: Home/Self Care In-house Referral: Clinical Social Work Discharge Planning Services: CM Consult   Living arrangements for the past 2 months: Group Home                                      Prior Living Arrangements/Services Living arrangements for the past 2 months: Group Home Lives with:: Facility Resident Patient language and need for interpreter reviewed:: Yes Do you feel safe going back to the place where you live?: Yes            Criminal Activity/Legal Involvement Pertinent to Current Situation/Hospitalization: No - Comment as needed  Activities of Daily Living      Permission Sought/Granted                  Emotional Assessment Appearance:: Appears stated age     Orientation: : Oriented to Self   Psych Involvement: No (comment)  Admission diagnosis:  Acute encephalopathy [G93.40] Altered mental status, unspecified altered mental status type [R41.82] AMS (altered mental status) [R41.82] Patient Active Problem List   Diagnosis Date Noted   Acute encephalopathy 03/15/2021   Unresponsive    Metabolic encephalopathy 05/13/2017   UTI (urinary tract infection) 05/13/2017   Angina at rest [facility]) 06/18/2013   Precordial pain 06/18/2013   Shortness of breath    Nonspecific  abnormal electrocardiogram (ECG) (EKG)    Mentally challenged    Schizophrenia (HCC)    PCP:  Pcp, No Pharmacy:   [facility] - [place], [place] - [place] [address] [place] [address] Phone: [phone] Fax: [phone]  [facility] - [place], [place] - [address] [place] [address] Phone: [phone] Fax: [phone]     Social Determinants of Health (SDOH) Interventions    Readmission Risk Interventions No flowsheet data found.

## 2021-03-16 NOTE — Consult Note (Addendum)
[facility] Face-to-Face Psychiatry Consult   Reason for Consult:  Hx of schizophrenia, AMS possibly due to polypharmacy Referring Physician:  Orland Mustard Patient Identification: Terry Schroeder MRN:  [id number] Principal Diagnosis: Acute encephalopathy Diagnosis:  Principal Problem:   Acute encephalopathy Active Problems:   Schizophrenia [facility])   Unresponsive  Assessment  Terry Schroeder is a 65 y.o. male admitted medically  03/15/2021 12:57 PM for AMS.Patient carries the psychiatric diagnoses of schizophrenia and has a past medical history of MR .Psychiatry was consulted for concern for polypharmacy.   He meets criteria for encephalopathy based on presentation as well as test done since presentation.  Outpatient psychotropic medications include Abilify , cogentin  BID, and Hydroxyzine  BID PRN and historically he has had a good response to these medications. He was  compliant with medications prior to admission as evidenced by patient being in group home and group home personnel confirming patient is receiving his medications. On initial examination, patient appears very drowsy with a noticeable cough but not psychotic. We plan to recommend continued treatment for his schizophrenia  Patient appears very drowsy on assessment and required frequenting reawakening during assessment. Assessment was also limited by patient's MR; however based on patient's account he has noted that he has been progressively drowsy over the past few weeks; however his admission was precipitated by a sudden change in AMS. Patient then had a rapid called the night he was admitted for severe bradycardia and being difficult to arouse. Thus far medical work-up has been unremarkable thus far. Patient's psychotropic medications do not appear to be likely cause as well. Patient has been taking Abilify (an SGA w/ decreased sedation ability due to decreased anticholinergic properties) therefore making this medication unlikely  to be the cause for increased drowsiness and encephalopathy. We would recommend his cogentin be discontinued as it is likely prescribed as EPS prophylaxis; however as Abilify has less anticholinergic properties. Patient's group home also mentioned that most of his allergy medication are PRN ant this has been documented. Thus, patient has been known to respond well to Abilify and at this time it appears unlikely that it is the etiology behind sudden AMS. Patient would benefit from remaining on his Abilify for control of his known schizophrenia.   Labs/ Imaging reviewed: EEG- Encephalopathy, CT head shows no evidence of acute intracranial abnormality. CTA head and neck: Within normal limits CXR: no active dx TSH- WNL, UA- neg, UDS: WNL, BMP- Neg, CBC- stable to past CBC's  AMS, etiology unknown Hx Schizophrenia - Restart Abilify  - Continue to hold home Cogentin  BID - Recommend continued medical workup  Safety At this time patient appears to be of low risk of harm to himself or others. Recommend routine observation.  Dispo - Per primary  Thank you for this consult, we will continue to follow.  Total Time spent with patient: 30 minutes  Subjective:   Terry Schroeder is a 65 y.o. male patient admitted with AMS. ON patient had a rapid called due to bradycardia with inability to arouse patient. Per EMR patient responded to sternal rub, woke up and vomited. Patient did not require atropine.   HPI:  Patient noted to have a difficult time staying awake for assessment this AM, but would wake up to verbal stimuli. Patient was oriented to person, place, rime and somewhat to situation. Patient reported that he has been feling more sleepy over the past few weeks and especially in the last week. Patient endorsed that he goes to  [facility] and takes Abilify. Patient endorsed that he did not remember what happened if he did not take Abilify. Patient denied SI, HI, and AVH. Patient endorsed that he  will drink EtOH "when I can get it" but denied that this was frequent occurrence (ie not daily or weekly)  Objectively patient appears very agreeable and this may be due to his MR. Patient did not appear psychotic.   Collateral, Mrs. Rockney Ghee at the group home: For 1 week the patient would appear to be dozing off every time he sat down for about 1 week. Yesterday patient could not be aroused by tactile stimulation and group home called 911. Mrs. Rockney Ghee reports that she handed over patient's medication to the pharmacy team here, but does recall that some of his allergy medication are PRN and he is not receiving them all daily.   PPH: Patient at [facility] prescribed Abilify , Cogentin  BID, Vistaril  BID PRN  Hx of Haldol? Appears to have had dystonic rxn per description to psychiatry attending  Past Medical History:  Past Medical History:  Diagnosis Date   Mentally challenged    Nonspecific abnormal electrocardiogram (ECG) (EKG)    Schizophrenia (HCC)    Shortness of breath     Past Surgical History:  Procedure Laterality Date   LEFT HEART CATHETERIZATION WITH CORONARY ANGIOGRAM N/A 06/18/2013   Procedure: LEFT HEART CATHETERIZATION WITH CORONARY ANGIOGRAM;  Surgeon: Corky Crafts, MD;  Location: [facility] CATH LAB;  Service: Cardiovascular;  Laterality: N/A;   Family History: History reviewed. No pertinent family history. Family Psychiatric  History: Unknown Social History:  Social History   Substance and Sexual Activity  Alcohol Use No     Social History   Substance and Sexual Activity  Drug Use No    Social History   Socioeconomic History   Marital status: Single    Spouse name: Not on file   Number of children: Not on file   Years of education: Not on file   Highest education level: Not on file  Occupational History   Occupation: Disabled  Tobacco Use   Smoking status: Never   Smokeless tobacco: Never  Substance and Sexual Activity   Alcohol use: No   Drug  use: No   Sexual activity: Not on file  Other Topics Concern   Not on file  Social History Narrative   Lives in a group home. No information on family history available.   Social Determinants of Health   Financial Resource Strain: Not on file  Food Insecurity: Not on file  Transportation Needs: Not on file  Physical Activity: Not on file  Stress: Not on file  Social Connections: Not on file   Additional Social History:    Allergies:  No Known Allergies  Labs:  Results for orders placed or performed during the hospital encounter of 03/15/21 (from the past 48 hour(s))  CBG monitoring, ED     Status: None   Collection Time: 03/15/21 12:59 PM  Result Value Ref Range   Glucose-Capillary 96 70 - 99 mg/dL    Comment: Glucose reference range applies only to samples taken after fasting for at least 8 hours.  Ammonia     Status: None   Collection Time: 03/15/21 12:59 PM  Result Value Ref Range   Ammonia 28 9 - 35 umol/L    Comment: Performed at [facility], [address] [place], [place] [address]  Resp Panel by RT-PCR (Flu A&B, Covid) Nasopharyngeal Swab  Status: None   Collection Time: 03/15/21  1:03 PM   Specimen: Nasopharyngeal Swab; Nasopharyngeal(NP) swabs in vial transport medium  Result Value Ref Range   SARS Coronavirus 2 by RT PCR NEGATIVE NEGATIVE    Comment: (NOTE) SARS-CoV-2 target nucleic acids are NOT DETECTED.  The SARS-CoV-2 RNA is generally detectable in upper respiratory specimens during the acute phase of infection. The lowest concentration of SARS-CoV-2 viral copies this assay can detect is 138 copies/mL. A negative result does not preclude SARS-Cov-2 infection and should not be used as the sole basis for treatment or other patient management decisions. A negative result may occur with  improper specimen collection/handling, submission of specimen other than nasopharyngeal swab, presence of viral mutation(s) within the areas targeted by this  assay, and inadequate number of viral copies(<138 copies/mL). A negative result must be combined with clinical observations, patient history, and epidemiological information. The expected result is Negative.  Fact Sheet for Patients:  BloggerCourse.com  Fact Sheet for Healthcare Providers:  SeriousBroker.it  This test is no t yet approved or cleared by the [place] FDA and  has been authorized for detection and/or diagnosis of SARS-CoV-2 by FDA under an Emergency Use Authorization (EUA). This EUA will remain  in effect (meaning this test can be used) for the duration of the COVID-19 declaration under Section 564(b)(1) of the Act, 21 U.S.C.section 360bbb-3(b)(1), unless the authorization is terminated  or revoked sooner.       Influenza A by PCR NEGATIVE NEGATIVE   Influenza B by PCR NEGATIVE NEGATIVE    Comment: (NOTE) The Xpert Xpress SARS-CoV-2/FLU/RSV plus assay is intended as an aid in the diagnosis of influenza from Nasopharyngeal swab specimens and should not be used as a sole basis for treatment. Nasal washings and aspirates are unacceptable for Xpert Xpress SARS-CoV-2/FLU/RSV testing.  Fact Sheet for Patients: BloggerCourse.com  Fact Sheet for Healthcare Providers: SeriousBroker.it  This test is not yet approved or cleared by the [place] FDA and has been authorized for detection and/or diagnosis of SARS-CoV-2 by FDA under an Emergency Use Authorization (EUA). This EUA will remain in effect (meaning this test can be used) for the duration of the COVID-19 declaration under Section 564(b)(1) of the Act, 21 U.S.C. section 360bbb-3(b)(1), unless the authorization is terminated or revoked.  Performed at [facility], [address] [place], [place] [address]   Ethanol     Status: None   Collection Time: 03/15/21  1:03 PM  Result Value Ref Range    Alcohol, Ethyl (B) <10 <10 mg/dL    Comment: (NOTE) Lowest detectable limit for serum alcohol is 10 mg/dL.  For medical purposes only. Performed at [facility], [address] [place], [place] [address]   Protime-INR     Status: None   Collection Time: 03/15/21  1:03 PM  Result Value Ref Range   Prothrombin Time 13.1 11.4 - 15.2 seconds   INR 1.0 0.8 - 1.2    Comment: (NOTE) INR goal varies based on device and disease states. Performed at [facility], [address] [place], [place] [address]   CBC     Status: Abnormal   Collection Time: 03/15/21  1:03 PM  Result Value Ref Range   WBC 7.4 4.0 - 10.5 K/uL   RBC 5.83 (H) 4.22 - 5.81 MIL/uL   Hemoglobin 13.2 13.0 - 17.0 g/dL   HCT 63.1 49.7 - 02.6 %   MCV 70.7 (L) 80.0 - 100.0 fL  MCH 22.6 (L) 26.0 - 34.0 pg   MCHC 32.0 30.0 - 36.0 g/dL   RDW 16.1 09.6 - 04.5 %   Platelets 226 150 - 400 K/uL   nRBC 0.0 0.0 - 0.2 %    Comment: Performed at [facility], [address] [place], [place] [address]  Differential     Status: None   Collection Time: 03/15/21  1:03 PM  Result Value Ref Range   Neutrophils Relative % 83 %   Neutro Abs 6.2 1.7 - 7.7 K/uL   Lymphocytes Relative 11 %   Lymphs Abs 0.8 0.7 - 4.0 K/uL   Monocytes Relative 5 %   Monocytes Absolute 0.3 0.1 - 1.0 K/uL   Eosinophils Relative 0 %   Eosinophils Absolute 0.0 0.0 - 0.5 K/uL   Basophils Relative 1 %   Basophils Absolute 0.0 0.0 - 0.1 K/uL   Immature Granulocytes 0 %   Abs Immature Granulocytes 0.02 0.00 - 0.07 K/uL    Comment: Performed at [facility], [address] [place], [place] [address]  Comprehensive metabolic panel     Status: Abnormal   Collection Time: 03/15/21  1:03 PM  Result Value Ref Range   Sodium 137 135 - 145 mmol/L   Potassium 4.0 3.5 - 5.1 mmol/L   Chloride 105 98 - 111 mmol/L   CO2 25 22 - 32 mmol/L   Glucose, Bld 103 (H) 70 - 99 mg/dL    Comment: Glucose reference range applies only to samples taken  after fasting for at least 8 hours.   BUN 11 8 - 23 mg/dL   Creatinine, Ser 8.29 (H) 0.61 - 1.24 mg/dL   Calcium 9.1 8.9 - 56.2 mg/dL   Total Protein 7.1 6.5 - 8.1 g/dL   Albumin 3.8 3.5 - 5.0 g/dL   AST 28 15 - 41 U/L   ALT 29 0 - 44 U/L   Alkaline Phosphatase 65 38 - 126 U/L   Total Bilirubin 0.7 0.3 - 1.2 mg/dL   GFR, Estimated >13 >08 mL/min    Comment: (NOTE) Calculated using the CKD-EPI Creatinine Equation (2021)    Anion gap 7 5 - 15    Comment: Performed at [facility], [address] [place], [place] [address]  Troponin I (High Sensitivity)     Status: None   Collection Time: 03/15/21  1:03 PM  Result Value Ref Range   Troponin I (High Sensitivity) 4 <18 ng/L    Comment: (NOTE) Elevated high sensitivity troponin I (hsTnI) values and significant  changes across serial measurements may suggest ACS but many other  chronic and acute conditions are known to elevate hsTnI results.  Refer to the "Links" section for chest pain algorithms and additional  guidance. Performed at [facility], [address] [place], [place] [address]   Lactic acid, plasma     Status: None   Collection Time: 03/15/21  1:05 PM  Result Value Ref Range   Lactic Acid, Venous 1.6 0.5 - 1.9 mmol/L    Comment: Performed at [facility], [address], [place], [place] [address]  I-Stat arterial blood gas, [facility] ED)     Status: Abnormal   Collection Time: 03/15/21  1:12 PM  Result Value Ref Range   pH, Arterial 7.484 (H) 7.350 - 7.450   pCO2 arterial 35.1 32.0 - 48.0 mmHg   pO2, Arterial 165 (H) 83.0 - 108.0 mmHg   Bicarbonate 26.4 20.0 -  28.0 mmol/L   TCO2 27 22 - 32 mmol/L   O2 Saturation 100.0 %   Acid-Base Excess 3.0 (H) 0.0 - 2.0 mmol/L   Sodium 137 135 - 145 mmol/L   Potassium 3.7 3.5 - 5.1 mmol/L   Calcium, Ion 1.18 1.15 - 1.40 mmol/L   HCT 41.0 39.0 - 52.0 %   Hemoglobin 13.9 13.0 - 17.0 g/dL   Patient temperature 18.5 F    Collection site RADIAL, ALLEN'S TEST  ACCEPTABLE    Drawn by RT    Sample type ARTERIAL   I-stat chem 8, ED (not at [facility] or [facility])     Status: Abnormal   Collection Time: 03/15/21  1:14 PM  Result Value Ref Range   Sodium 139 135 - 145 mmol/L   Potassium 4.1 3.5 - 5.1 mmol/L   Chloride 106 98 - 111 mmol/L   BUN 16 8 - 23 mg/dL   Creatinine, Ser 6.31 0.61 - 1.24 mg/dL   Glucose, Bld 497 (H) 70 - 99 mg/dL    Comment: Glucose reference range applies only to samples taken after fasting for at least 8 hours.   Calcium, Ion 1.00 (L) 1.15 - 1.40 mmol/L   TCO2 26 22 - 32 mmol/L   Hemoglobin 14.6 13.0 - 17.0 g/dL   HCT 02.6 37.8 - 58.8 %  Troponin I (High Sensitivity)     Status: None   Collection Time: 03/15/21  3:04 PM  Result Value Ref Range   Troponin I (High Sensitivity) 3 <18 ng/L    Comment: (NOTE) Elevated high sensitivity troponin I (hsTnI) values and significant  changes across serial measurements may suggest ACS but many other  chronic and acute conditions are known to elevate hsTnI results.  Refer to the "Links" section for chest pain algorithms and additional  guidance. Performed at [facility], [address] [place], [place] [address]   Lactic acid, plasma     Status: None   Collection Time: 03/15/21  3:05 PM  Result Value Ref Range   Lactic Acid, Venous 1.6 0.5 - 1.9 mmol/L    Comment: Performed at [facility], [address], [place], [place] [address]  Urine rapid drug screen (hosp performed)     Status: None   Collection Time: 03/15/21  3:37 PM  Result Value Ref Range   Opiates NONE DETECTED NONE DETECTED   Cocaine NONE DETECTED NONE DETECTED   Benzodiazepines NONE DETECTED NONE DETECTED   Amphetamines NONE DETECTED NONE DETECTED   Tetrahydrocannabinol NONE DETECTED NONE DETECTED   Barbiturates NONE DETECTED NONE DETECTED    Comment: (NOTE) DRUG SCREEN FOR MEDICAL PURPOSES ONLY.  IF CONFIRMATION IS NEEDED FOR ANY PURPOSE, NOTIFY LAB WITHIN 5 DAYS.  LOWEST DETECTABLE LIMITS FOR  URINE DRUG SCREEN Drug Class                     Cutoff (ng/mL) Amphetamine and metabolites    1000 Barbiturate and metabolites    200 Benzodiazepine                 200 Tricyclics and metabolites     300 Opiates and metabolites        300 Cocaine and metabolites        300 THC                            50 Performed at [facility], [address] [place], [place] [address]   Urinalysis, Routine w reflex microscopic Urine, Clean Catch     Status: Abnormal   Collection Time: 03/15/21  3:37 PM  Result Value Ref Range   Color, Urine STRAW (A) YELLOW   APPearance CLEAR CLEAR   Specific Gravity, Urine 1.035 (H) 1.005 - 1.030   pH 6.0 5.0 - 8.0   Glucose, UA NEGATIVE NEGATIVE mg/dL   Hgb urine dipstick NEGATIVE NEGATIVE   Bilirubin Urine NEGATIVE NEGATIVE   Ketones, ur NEGATIVE NEGATIVE mg/dL   Protein, ur NEGATIVE NEGATIVE mg/dL   Nitrite NEGATIVE NEGATIVE   Leukocytes,Ua NEGATIVE NEGATIVE    Comment: Performed at [facility], [address] [place], [place] [address]  HIV Antibody (routine testing w rflx)     Status: None   Collection Time: 03/15/21  5:03 PM  Result Value Ref Range   HIV Screen 4th Generation wRfx Non Reactive Non Reactive    Comment: Performed at [facility], [address], [place], [place] [address]  CBC     Status: Abnormal   Collection Time: 03/16/21  1:47 AM  Result Value Ref Range   WBC 8.7 4.0 - 10.5 K/uL   RBC 6.21 (H) 4.22 - 5.81 MIL/uL   Hemoglobin 13.9 13.0 - 17.0 g/dL   HCT 02.5 42.7 - 06.2 %   MCV 69.6 (L) 80.0 - 100.0 fL    Comment: REPEATED TO VERIFY   MCH 22.4 (L) 26.0 - 34.0 pg   MCHC 32.2 30.0 - 36.0 g/dL   RDW 37.6 28.3 - 15.1 %   Platelets 223 150 - 400 K/uL   nRBC 0.0 0.0 - 0.2 %    Comment: Performed at [facility], [address], [place], [place] [address]  TSH     Status: None   Collection Time: 03/16/21  1:47 AM  Result Value Ref Range   TSH 0.624 0.350 - 4.500 uIU/mL    Comment: Performed by a  3rd Generation assay with a functional sensitivity of <=0.01 uIU/mL. Performed at [facility], [address], [place], [place] [address]   Basic metabolic panel     Status: Abnormal   Collection Time: 03/16/21  1:47 AM  Result Value Ref Range   Sodium 140 135 - 145 mmol/L   Potassium 3.6 3.5 - 5.1 mmol/L   Chloride 104 98 - 111 mmol/L   CO2 27 22 - 32 mmol/L   Glucose, Bld 114 (H) 70 - 99 mg/dL    Comment: Glucose reference range applies only to samples taken after fasting for at least 8 hours.   BUN 12 8 - 23 mg/dL   Creatinine, Ser 6.26 0.61 - 1.24 mg/dL   Calcium 9.2 8.9 - 94.8 mg/dL   GFR, Estimated >54 >62 mL/min    Comment: (NOTE) Calculated using the CKD-EPI Creatinine Equation (2021)    Anion gap 9 5 - 15    Comment: Performed at [facility], [address] [place], [place] [address]  Magnesium     Status: None   Collection Time: 03/16/21  1:47 AM  Result Value Ref Range   Magnesium 1.8 1.7 - 2.4 mg/dL    Comment: Performed at [facility], [address] [place], [place] [address]  Troponin I (High Sensitivity)     Status: None   Collection Time: 03/16/21  1:47 AM  Result Value Ref Range   Troponin I (High Sensitivity) 4 <18 ng/L  Comment: (NOTE) Elevated high sensitivity troponin I (hsTnI) values and significant  changes across serial measurements may suggest ACS but many other  chronic and acute conditions are known to elevate hsTnI results.  Refer to the "Links" section for chest pain algorithms and additional  guidance. Performed at [facility], [address], [place], [place] [address]   Troponin I (High Sensitivity)     Status: None   Collection Time: 03/16/21  3:51 AM  Result Value Ref Range   Troponin I (High Sensitivity) 5 <18 ng/L    Comment: (NOTE) Elevated high sensitivity troponin I (hsTnI) values and significant  changes across serial measurements may suggest ACS but many other  chronic and acute conditions are  known to elevate hsTnI results.  Refer to the "Links" section for chest pain algorithms and additional  guidance. Performed at [facility], [address] [place], [place] [address]   Troponin I (High Sensitivity)     Status: None   Collection Time: 03/16/21  5:22 AM  Result Value Ref Range   Troponin I (High Sensitivity) 4 <18 ng/L    Comment: (NOTE) Elevated high sensitivity troponin I (hsTnI) values and significant  changes across serial measurements may suggest ACS but many other  chronic and acute conditions are known to elevate hsTnI results.  Refer to the "Links" section for chest pain algorithms and additional  guidance. Performed at [facility], [address], [place], [place] [address]     Current Facility-Administered Medications  Medication Dose Route Frequency Provider Last Rate Last Admin   0.9 %  sodium chloride infusion  250 mL Intravenous PRN Orland Mustard, MD       atropine 1 MG/10ML injection            enoxaparin (LOVENOX) injection 40 mg  40 mg Subcutaneous Q24H Orland Mustard, MD   40 mg at 03/15/21 2138   fluticasone (FLONASE) 50 MCG/ACT nasal spray 2 spray  2 spray Each Nare QHS Orland Mustard, MD   2 spray at 03/15/21 2138   sodium chloride flush (NS) 0.9 % injection 3 mL  3 mL Intravenous Q12H Orland Mustard, MD   3 mL at 03/16/21 1147   sodium chloride flush (NS) 0.9 % injection 3 mL  3 mL Intravenous PRN Orland Mustard, MD        Psychiatric Specialty Exam:  Presentation  General Appearance: -- (drowsy but easily arousable)  Eye Contact:Fair  Speech:Slow  Speech Volume:Decreased  Handedness:No data recorded  Mood and Affect  Mood:Euthymic  Affect:Flat   Thought Process  Thought Processes:Linear; Goal Directed  Descriptions of Associations:Circumstantial  Orientation:Partial (oriented to person, month, place, and somewhat to situation not oriented to year)  Thought Content:Perseveration (very concrete, perseverates  on getting meal, but redirectable)  History of Schizophrenia/Schizoaffective disorder:No data recorded Duration of Psychotic Symptoms:No data recorded Hallucinations:Hallucinations: None  Ideas of Reference:None  Suicidal Thoughts:Suicidal Thoughts: No  Homicidal Thoughts:Homicidal Thoughts: No   Sensorium  Memory:Immediate Poor; Recent Fair  Judgment:Impaired (IDD at baseline very concrete thinking)  Insight:None   Executive Functions  Concentration:Fair  Attention Span:Poor  Recall:No data recorded Fund of Knowledge:Poor  Language:Fair   Psychomotor Activity  Psychomotor Activity:Psychomotor Activity: Decreased   Assets  Assets:Housing   Sleep  Sleep:Sleep: Fair   Physical Exam: Physical Exam Constitutional:      Comments: Drowsy but arousable to verbal stimuli  HENT:     Head: Normocephalic and atraumatic.  Pulmonary:  Effort: Pulmonary effort is normal.  Musculoskeletal:     Comments: No significant rigidty noted in extremities   Review of Systems  Respiratory:  Positive for cough.   Psychiatric/Behavioral:  Negative for hallucinations and suicidal ideas. The patient is not nervous/anxious and does not have insomnia.   Blood pressure 113/80, pulse 64, temperature 97.9 F (36.6 C), temperature source Oral, resp. rate 19, height 5\' 6"  (1.676 m), weight 70 kg, SpO2 99 %. Body mass index is 24.91 kg/m.  PGY-2 Bobbye Morton, MD 03/16/2021 1:08 PM

## 2021-03-17 DIAGNOSIS — R4189 Other symptoms and signs involving cognitive functions and awareness: Secondary | ICD-10-CM | POA: Diagnosis not present

## 2021-03-17 DIAGNOSIS — G9340 Encephalopathy, unspecified: Secondary | ICD-10-CM | POA: Diagnosis not present

## 2021-03-17 DIAGNOSIS — F209 Schizophrenia, unspecified: Secondary | ICD-10-CM | POA: Diagnosis not present

## 2021-03-17 NOTE — TOC Transition Note (Signed)
Transition of Care [facility]) - CM/SW Discharge Note   Patient Details  Name: Terry Schroeder MRN: [id number] Date of Birth: 03/08/1956  [facility]) CM/SW Contact:  Carley Hammed, LCSWA Phone Number: 03/17/2021, 1:38 PM   Clinical Narrative:    Pt to be transported back to Group Home ([address] [place] [address]). Group Home director notified and updated.   Final next level of care: Group Home Barriers to Discharge: Barriers Resolved   Patient Goals and CMS Choice        Discharge Placement              Patient chooses bed at:  [facility]) Patient to be transferred to facility by: Benedetto Goad Name of family member notified: Jeannett Senior Patient and family notified of of transfer: 03/17/21  Discharge Plan and Services In-house Referral: Clinical Social Work Discharge Planning Services: CM Consult                                 Social Determinants of Health (SDOH) Interventions     Readmission Risk Interventions No flowsheet data found.

## 2021-03-17 NOTE — Discharge Summary (Signed)
Physician Discharge Summary  PIO EATHERLY HQI:[id number] DOB: 03/20/1956 DOA: 03/15/2021  PCP: Pcp, No  Admit date: 03/15/2021 Discharge date: 03/17/2021  Admitted From: Group home   Discharge disposition: Group home  Recommendations for Outpatient Follow-Up:   Follow up with your primary care provider in one week.  Check CBC, BMP, magnesium in the next visit Follow up with  psychiatrist as scheduled by the group home.  Discharge Diagnosis:   Principal Problem:   Acute encephalopathy Active Problems:   Schizophrenia (HCC)   Unresponsive  Discharge Condition: Improved.  Diet recommendation: .  Regular.  Wound care: None.  Code status: Full.   History of Present Illness:   Terry Schroeder is a 65 y.o. male with past medical history of mental retardation, schizophrenia presented to hospital with altered mental status and difficulty waking up.  Patient is from group home.  At the group home patient was more lethargic and was unresponsive even on sternal rub.  EMS was called in and was given Narcan without improvement in his mental status.  Vital ambulatory unremarkable including labs.  CT head scan did not show any acute abnormality.  CTA head and neck without large vessel occlusion.  EEG showed moderate diffuse encephalopathy, nonspecific etiology.  No seizures or epileptiform discharges were seen.  In the ED, code stroke was called initially.  Patient was then admitted to hospital for further evaluation and treatment.   Hospital Course:   Following conditions were addressed during hospitalization as listed below,  Acute metabolic encephalopathy Likely secondary to medication effect.  Cogentin on hold.  CT head CTA head neck EEG unremarkable.  Labs within normal limits.  No signs of infection at this time.  Urine drug screen was negative.  Spoke with the group home coordinator about holding Cogentin for now but will need psychiatry follow-up in the outpatient  setting.   Schizophrenia  Resumed  home medication except Cogentin.  Patient was seen by psychiatry during hospitalization.     Disposition.  Patient is currently at group home.    Disposition.  At this time, patient is stable for disposition to group home with PCP and psychiatry follow-up  Medical Consultants:   Neurology Psychiatry  Procedures:    EEG Subjective:   Today, patient was seen and examined at bedside.  Feels okay.  Denies any headache nausea vomiting dizziness.  Eating at bedside.  Discharge Exam:   Vitals:   03/17/21 0756 03/17/21 1206  BP: 117/86 (!) 135/96  Pulse: 77 (!) 102  Resp:    Temp: 98.1 F (36.7 C) 98.2 F (36.8 C)  SpO2: 99% 99%   Vitals:   03/17/21 0000 03/17/21 0400 03/17/21 0756 03/17/21 1206  BP: 118/89 (!) 83/67 117/86 (!) 135/96  Pulse: 88 83 77 (!) 102  Resp: 18 18    Temp: 98.2 F (36.8 C) 98.3 F (36.8 C) 98.1 F (36.7 C) 98.2 F (36.8 C)  TempSrc: Oral Oral Oral Oral  SpO2: 98% 100% 99% 99%  Weight:      Height:       General: Alert awake, not in obvious distress HENT: pupils equally reacting to light,  No scleral pallor or icterus noted. Oral mucosa is moist.  Chest:  Clear breath sounds.  Diminished breath sounds bilaterally. No crackles or wheezes.  CVS: S1 &S2 heard. No murmur.  Regular rate and rhythm. Abdomen: Soft, nontender, nondistended.  Bowel sounds are heard.   Extremities: No cyanosis, clubbing or edema.  Peripheral pulses are  palpable. Psych: Alert, awake and oriented, CNS:  No cranial nerve deficits.  Power equal in all extremities.   Skin: Warm and dry.  No rashes noted.  The results of significant diagnostics from this hospitalization (including imaging, microbiology, ancillary and laboratory) are listed below for reference.     Diagnostic Studies:   DG CHEST PORT 1 VIEW  Result Date: 03/15/2021 CLINICAL DATA:  Patient found unresponsive. EXAM: PORTABLE CHEST 1 VIEW COMPARISON:  May 13, 2017  FINDINGS: The heart size and mediastinal contours are within normal limits. Both lungs are clear. The visualized skeletal structures are unremarkable. IMPRESSION: No active disease. Electronically Signed   By: Gerome Sam III M.D.   On: 03/15/2021 17:15   EEG adult  Result Date: 03/15/2021 Charlsie Quest, MD     03/15/2021  2:23 PM Patient Name: Terry Schroeder MRN: [id number] Epilepsy Attending: Charlsie Quest Referring Physician/Provider: Kara Mead, NP Date: 03/15/2021 Duration: 24.02 mins Patient history: 65yo M with ams. EEG to evaluate for seizure Level of alertness:  lethargic AEDs during EEG study: None Technical aspects: This EEG study was done with scalp electrodes positioned according to the 10-20 International system of electrode placement. Electrical activity was acquired at a sampling rate of 500Hz  and reviewed with a high frequency filter of 70Hz  and a low frequency filter of 1Hz . EEG data were recorded continuously and digitally stored. Description: No posterior dominant rhythm was seen. EEG showed continuous generalized polymorphic disorganized predominantly 5-9 Hz theta-alpha activity as well as intermittent generalized 2-3hz  delta slowing. Hyperventilation and photic stimulation were not performed.   ABNORMALITY - Continuous slow, generalized IMPRESSION: This study is suggestive of moderate diffuse encephalopathy, nonspecific etiology. No seizures or epileptiform discharges were seen throughout the recording. Charlsie Quest   CT HEAD CODE STROKE WO CONTRAST  Result Date: 03/15/2021 CLINICAL DATA:  Code stroke. Neuro deficit, acute, stroke suspected. Found unresponsive. EXAM: CT HEAD WITHOUT CONTRAST TECHNIQUE: Contiguous axial images were obtained from the base of the skull through the vertex without intravenous contrast. COMPARISON:  Head CT 05/13/2017 FINDINGS: Brain: There is no evidence of an acute infarct, intracranial hemorrhage, mass, midline shift, or  extra-axial fluid collection. Mild cerebral atrophy is most notable in the parietal regions. Vascular: No hyperdense vessel. Skull: No fracture or suspicious osseous lesion. Sinuses/Orbits: No acute finding. Other: None. ASPECTS [facility] Stroke Program Early CT Score) - Ganglionic level infarction (caudate, lentiform nuclei, internal capsule, insula, M1-M3 cortex): 7 - Supraganglionic infarction (M4-M6 cortex): 3 Total score (0-10 with 10 being normal): 10 IMPRESSION: No evidence of acute intracranial abnormality.  ASPECTS of 10. These results were communicated to Dr. Selina Cooley at 1:24 pm on 03/15/2021 by text page via the [facility] messaging system. Electronically Signed   By: Sebastian Ache M.D.   On: 03/15/2021 13:24   CT ANGIO HEAD NECK W WO CM W PERF (CODE STROKE)  Result Date: 03/15/2021 CLINICAL DATA:  Unresponsive. Right facial droop. Concern for basilar artery occlusion. EXAM: CT ANGIOGRAPHY HEAD AND NECK CT PERFUSION BRAIN TECHNIQUE: Multidetector CT imaging of the head and neck was performed using the standard protocol during bolus administration of intravenous contrast. Multiplanar CT image reconstructions and MIPs were obtained to evaluate the vascular anatomy. Carotid stenosis measurements (when applicable) are obtained utilizing NASCET criteria, using the distal internal carotid diameter as the denominator. Multiphase CT imaging of the brain was performed following IV bolus contrast injection. Subsequent parametric perfusion maps were calculated using RAPID software. CONTRAST:  OMNIPAQUE IOHEXOL  350 MG/ML SOLN COMPARISON:  None. FINDINGS: CTA NECK FINDINGS Aortic arch: Standard 3 vessel aortic arch with widely patent arch vessel origins. Right carotid system: Patent without evidence of stenosis, dissection, or significant atherosclerosis. Left carotid system: Patent without evidence of stenosis, dissection, or significant atherosclerosis. Vertebral arteries: Patent without evidence of stenosis,  dissection, or significant atherosclerosis. Slightly dominant left vertebral artery. Skeleton: Multiple dental caries and periapical lucencies. Moderate lower cervical spondylosis. Other neck: No evidence of cervical lymphadenopathy or mass. Upper chest: Clear lung apices. Review of the MIP images confirms the above findings CTA HEAD FINDINGS Anterior circulation: The internal carotid arteries are widely patent from skull base to carotid termini. ACAs and MCAs are patent without evidence of a proximal branch occlusion or significant proximal stenosis. No aneurysm is identified. Posterior circulation: The intracranial vertebral arteries are widely patent to the basilar. The basilar artery is patent with minimal irregularity and no significant stenosis. There is a small left posterior communicating artery. Both PCAs are patent with a mild distal left P1 stenosis noted. No aneurysm is identified. Venous sinuses: As permitted by contrast timing, patent. Anatomic variants: None. Review of the MIP images confirms the above findings CT Brain Perfusion Findings: ASPECTS: 10 CBF (<30%) Volume: 0 mL Perfusion (Tmax>6.0s) volume: 0 mL Mismatch Volume: 0 mL Infarction Location: None IMPRESSION: 1. No large vessel occlusion or flow-limiting proximal stenosis in the head or neck. 2. Negative CTP. These results were communicated to Dr. Selina Cooley at 1:42 pm on 03/15/2021 by text page via the [facility] messaging system. Electronically Signed   By: Sebastian Ache M.D.   On: 03/15/2021 13:42     Labs:   Basic Metabolic Panel: Recent Labs  Lab 03/15/21 1303 03/15/21 1312 03/15/21 1314 03/16/21 0147  NA 137 137 139 140  K 4.0 3.7 4.1 3.6  CL 105  --  106 104  CO2 25  --   --  27  GLUCOSE 103*  --  104* 114*  BUN 11  --  16 12  CREATININE 1.25*  --  1.20 1.08  CALCIUM 9.1  --   --  9.2  MG  --   --   --  1.8   GFR Estimated Creatinine Clearance: 62.4 mL/min (by C-G formula based on SCr of 1.08 mg/dL). Liver Function  Tests: Recent Labs  Lab 03/15/21 1303  AST 28  ALT 29  ALKPHOS 65  BILITOT 0.7  PROT 7.1  ALBUMIN 3.8   No results for input(s): LIPASE, AMYLASE in the last 168 hours. Recent Labs  Lab 03/15/21 1259  AMMONIA 28   Coagulation profile Recent Labs  Lab 03/15/21 1303  INR 1.0    CBC: Recent Labs  Lab 03/15/21 1303 03/15/21 1312 03/15/21 1314 03/16/21 0147  WBC 7.4  --   --  8.7  NEUTROABS 6.2  --   --   --   HGB 13.2 13.9 14.6 13.9  HCT 41.2 41.0 43.0 43.2  MCV 70.7*  --   --  69.6*  PLT 226  --   --  223   Cardiac Enzymes: No results for input(s): CKTOTAL, CKMB, CKMBINDEX, TROPONINI in the last 168 hours. BNP: Invalid input(s): POCBNP CBG: Recent Labs  Lab 03/15/21 1259 03/16/21 0123  GLUCAP 96 114*   D-Dimer No results for input(s): DDIMER in the last 72 hours. Hgb A1c No results for input(s): HGBA1C in the last 72 hours. Lipid Profile No results for input(s): CHOL, HDL, LDLCALC, TRIG, CHOLHDL, LDLDIRECT in the  last 72 hours. Thyroid function studies Recent Labs    03/16/21 0147  TSH 0.624   Anemia work up No results for input(s): VITAMINB12, FOLATE, FERRITIN, TIBC, IRON, RETICCTPCT in the last 72 hours. Microbiology Recent Results (from the past 240 hour(s))  Resp Panel by RT-PCR (Flu A&B, Covid) Nasopharyngeal Swab     Status: None   Collection Time: 03/15/21  1:03 PM   Specimen: Nasopharyngeal Swab; Nasopharyngeal(NP) swabs in vial transport medium  Result Value Ref Range Status   SARS Coronavirus 2 by RT PCR NEGATIVE NEGATIVE Final    Comment: (NOTE) SARS-CoV-2 target nucleic acids are NOT DETECTED.  The SARS-CoV-2 RNA is generally detectable in upper respiratory specimens during the acute phase of infection. The lowest concentration of SARS-CoV-2 viral copies this assay can detect is 138 copies/mL. A negative result does not preclude SARS-Cov-2 infection and should not be used as the sole basis for treatment or other patient management  decisions. A negative result may occur with  improper specimen collection/handling, submission of specimen other than nasopharyngeal swab, presence of viral mutation(s) within the areas targeted by this assay, and inadequate number of viral copies(<138 copies/mL). A negative result must be combined with clinical observations, patient history, and epidemiological information. The expected result is Negative.  Fact Sheet for Patients:  BloggerCourse.com  Fact Sheet for Healthcare Providers:  SeriousBroker.it  This test is no t yet approved or cleared by the [place] FDA and  has been authorized for detection and/or diagnosis of SARS-CoV-2 by FDA under an Emergency Use Authorization (EUA). This EUA will remain  in effect (meaning this test can be used) for the duration of the COVID-19 declaration under Section 564(b)(1) of the Act, 21 U.S.C.section 360bbb-3(b)(1), unless the authorization is terminated  or revoked sooner.       Influenza A by PCR NEGATIVE NEGATIVE Final   Influenza B by PCR NEGATIVE NEGATIVE Final    Comment: (NOTE) The Xpert Xpress SARS-CoV-2/FLU/RSV plus assay is intended as an aid in the diagnosis of influenza from Nasopharyngeal swab specimens and should not be used as a sole basis for treatment. Nasal washings and aspirates are unacceptable for Xpert Xpress SARS-CoV-2/FLU/RSV testing.  Fact Sheet for Patients: BloggerCourse.com  Fact Sheet for Healthcare Providers: SeriousBroker.it  This test is not yet approved or cleared by the [place] FDA and has been authorized for detection and/or diagnosis of SARS-CoV-2 by FDA under an Emergency Use Authorization (EUA). This EUA will remain in effect (meaning this test can be used) for the duration of the COVID-19 declaration under Section 564(b)(1) of the Act, 21 U.S.C. section 360bbb-3(b)(1), unless the  authorization is terminated or revoked.  Performed at [facility], [address] [place], [place] [address]   Urine Culture     Status: Abnormal   Collection Time: 03/15/21  4:26 PM   Specimen: Urine, Clean Catch  Result Value Ref Range Status   Specimen Description URINE, CLEAN CATCH  Final   Special Requests NONE  Final   Culture (A)  Final    <10,000 COLONIES/mL INSIGNIFICANT GROWTH Performed at [facility], [address] [place], [place] [address]    Report Status 03/16/2021 FINAL  Final     Discharge Instructions:   Discharge Instructions     Diet general   Complete by: As directed    Discharge instructions   Complete by: As directed    Follow-up with your primary care physician in 1 week.  Follow-up with your  psychiatrist as scheduled by the group home.   Increase activity slowly   Complete by: As directed       Allergies as of 03/17/2021   No Known Allergies      Medication List     STOP taking these medications    benztropine 1 MG tablet Commonly known as: COGENTIN       TAKE these medications    ARIPiprazole 10 MG tablet Commonly known as: ABILIFY Take 10 mg by mouth at bedtime.   cetirizine 10 MG tablet Commonly known as: ZYRTEC Take 10 mg by mouth at bedtime as needed for allergies.   fexofenadine 180 MG tablet Commonly known as: ALLEGRA Take 180 mg by mouth daily as needed for allergies or rhinitis.   fluticasone 50 MCG/ACT nasal spray Commonly known as: FLONASE Place 2 sprays into both nostrils at bedtime.   hydrOXYzine 50 MG tablet Commonly known as: ATARAX/VISTARIL Take 50 mg by mouth 2 (two) times daily as needed for anxiety.   oxybutynin 5 MG tablet Commonly known as: DITROPAN Take 5 mg by mouth 2 (two) times daily.         Time coordinating discharge: 39 minutes  Signed:  Shianna Bally  [facility] 03/17/2021, 1:00 PM

## 2021-03-17 NOTE — Plan of Care (Signed)
  Problem: Education: Goal: Knowledge of General Education information will improve Description: Including pain rating scale, medication(s)/side effects and non-pharmacologic comfort measures Outcome: Adequate for Discharge   Problem: Health Behavior/Discharge Planning: Goal: Ability to manage health-related needs will improve Outcome: Adequate for Discharge   Problem: Clinical Measurements: Goal: Ability to maintain clinical measurements within normal limits will improve Outcome: Adequate for Discharge Goal: Will remain free from infection Outcome: Adequate for Discharge Goal: Diagnostic test results will improve Outcome: Adequate for Discharge Goal: Respiratory complications will improve Outcome: Adequate for Discharge Goal: Cardiovascular complication will be avoided Outcome: Adequate for Discharge   Problem: Activity: Goal: Risk for activity intolerance will decrease Outcome: Adequate for Discharge   Problem: Nutrition: Goal: Adequate nutrition will be maintained Outcome: Adequate for Discharge   Problem: Coping: Goal: Level of anxiety will decrease Outcome: Adequate for Discharge   Problem: Elimination: Goal: Will not experience complications related to bowel motility Outcome: Adequate for Discharge Goal: Will not experience complications related to urinary retention Outcome: Adequate for Discharge   Problem: Pain Managment: Goal: General experience of comfort will improve Outcome: Adequate for Discharge   Problem: Safety: Goal: Ability to remain free from injury will improve Outcome: Adequate for Discharge   Problem: Skin Integrity: Goal: Risk for impaired skin integrity will decrease Outcome: Adequate for Discharge   Problem: Education: Goal: Knowledge of disease or condition will improve Outcome: Adequate for Discharge Goal: Knowledge of secondary prevention will improve (SELECT ALL) Outcome: Adequate for Discharge Goal: Knowledge of patient specific  risk factors will improve (INDIVIDUALIZE FOR PATIENT) Outcome: Adequate for Discharge Goal: Individualized Educational Video(s) Outcome: Adequate for Discharge   Problem: Coping: Goal: Will verbalize positive feelings about self Outcome: Adequate for Discharge Goal: Will identify appropriate support needs Outcome: Adequate for Discharge   Problem: Health Behavior/Discharge Planning: Goal: Ability to manage health-related needs will improve Outcome: Adequate for Discharge   Problem: Self-Care: Goal: Ability to participate in self-care as condition permits will improve Outcome: Adequate for Discharge Goal: Verbalization of feelings and concerns over difficulty with self-care will improve Outcome: Adequate for Discharge Goal: Ability to communicate needs accurately will improve Outcome: Adequate for Discharge   Problem: Nutrition: Goal: Risk of aspiration will decrease Outcome: Adequate for Discharge Goal: Dietary intake will improve Outcome: Adequate for Discharge

## 2021-03-17 NOTE — Progress Notes (Signed)
Patient helped to dress, Ivs removed and patient transported via wheelchair to main entrance to await uber ride to group home.

## 2023-05-16 IMAGING — DX DG CHEST 1V PORT
1 series · 1 of 1 positions shown · non-contrast
Comparison: May 13, 2017

CLINICAL DATA: Patient found unresponsive.

EXAM:
PORTABLE CHEST 1 VIEW

[chest]
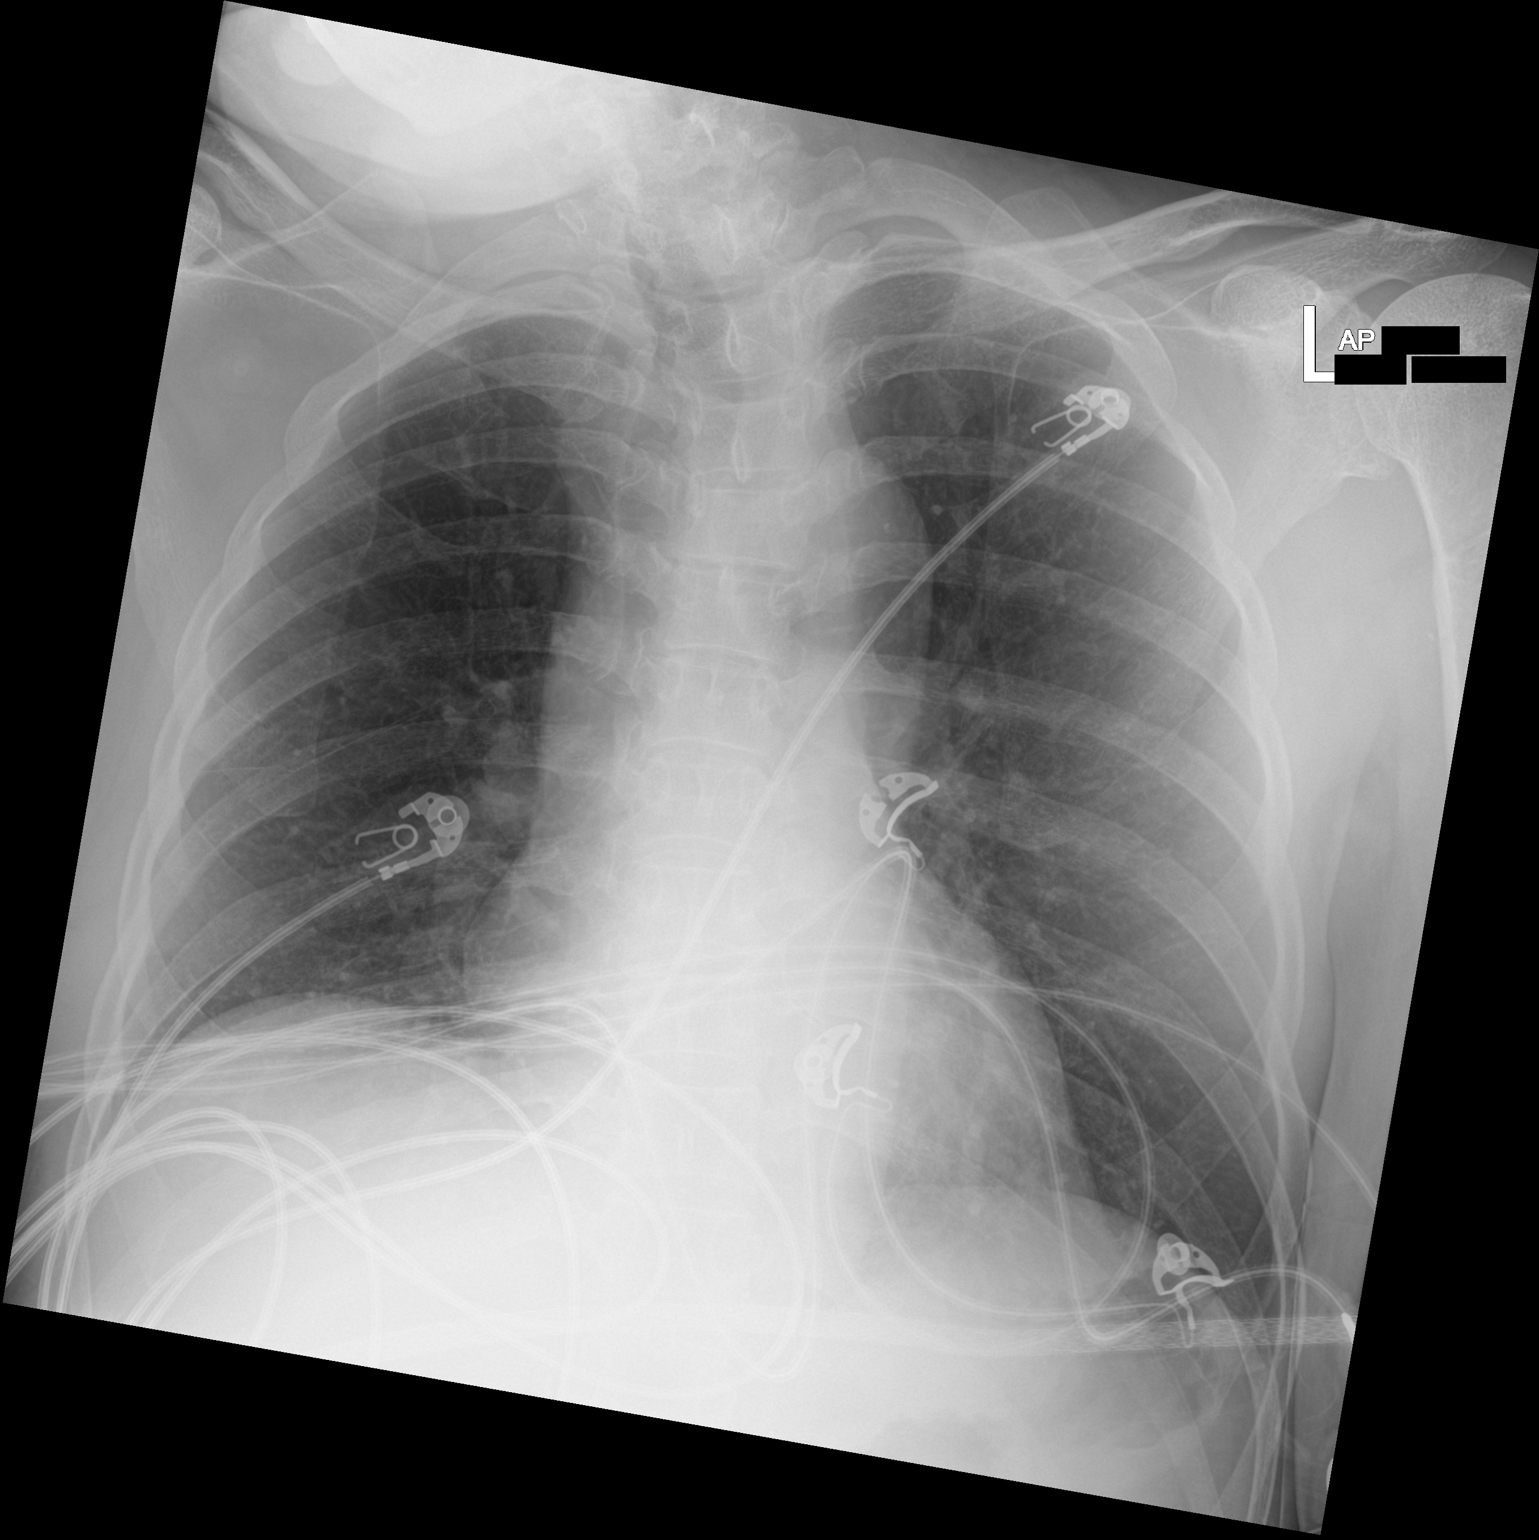

[1 of 1 positions shown; findings below may reference images not displayed]

FINDINGS: The heart size and mediastinal contours are within normal limits.
Both lungs are clear. The visualized skeletal structures are
unremarkable.
IMPRESSION: No active disease.

## 2023-05-16 IMAGING — CT CT HEAD CODE STROKE
4 series · 16 of 47 positions shown, 18 images · non-contrast
Comparison: Head CT 05/13/2017

CLINICAL DATA: Code stroke. Neuro deficit, acute, stroke suspected.
Found unresponsive.

EXAM:
CT HEAD WITHOUT CONTRAST
TECHNIQUE: Contiguous axial images were obtained from the base of the skull
through the vertex without intravenous contrast.

[Series 3: head wo · axial · 0.43mm/px · z∈[-134,-14]mm · 7 of 32 slices shown, 9 images]
[im 4/32  brain]
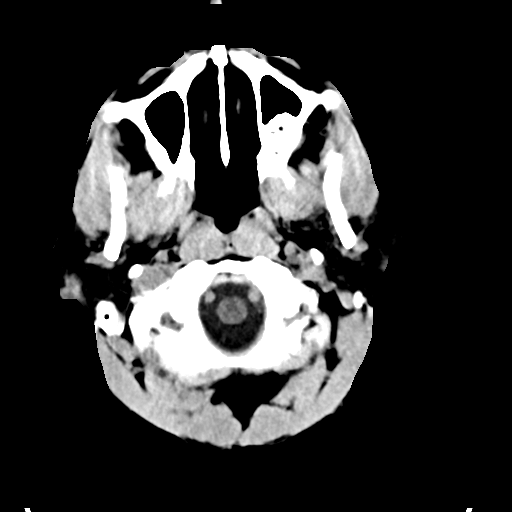
[im 4/32  bone]
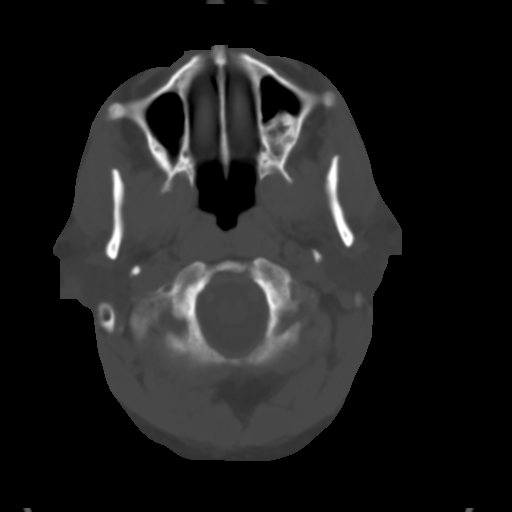
[im 8/32  brain]
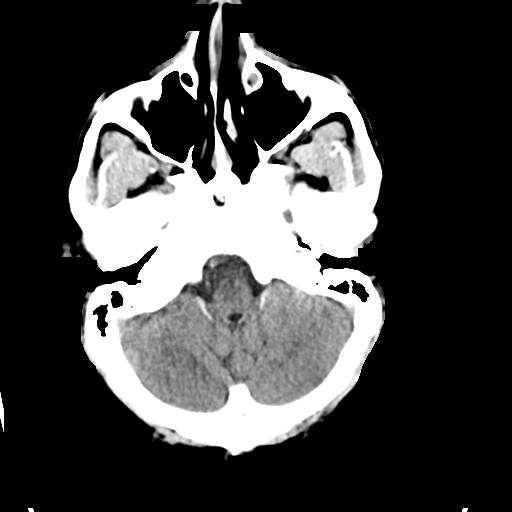
[im 12/32  brain]
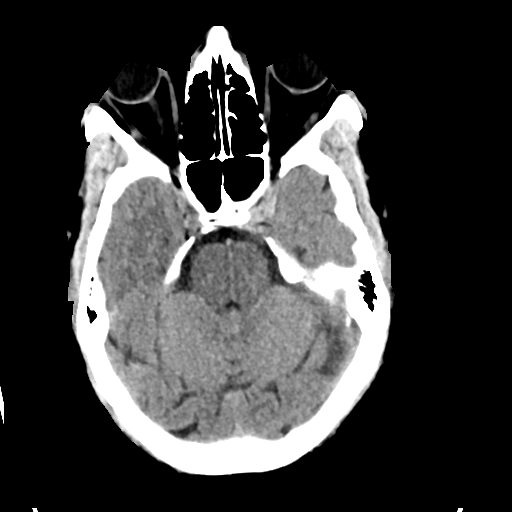
[im 16/32  brain]
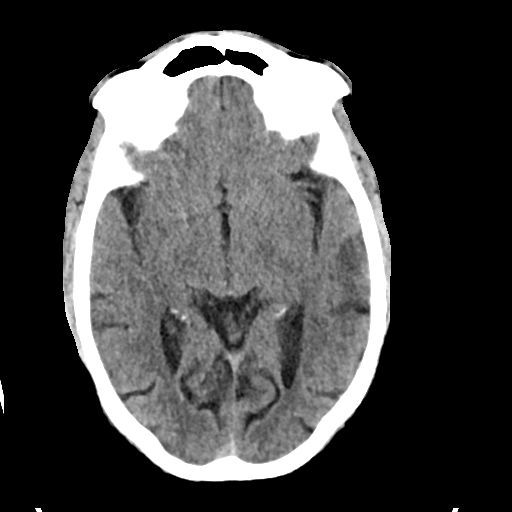
[im 20/32  brain]
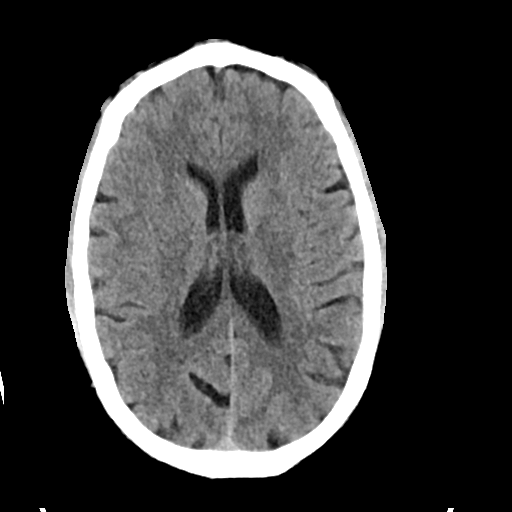
[im 20/32  bone]
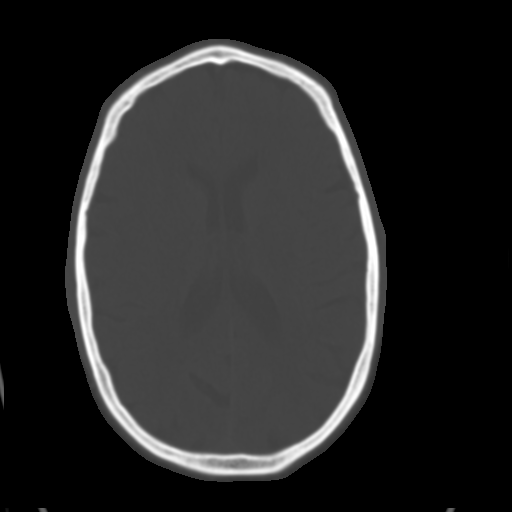
[im 24/32  brain]
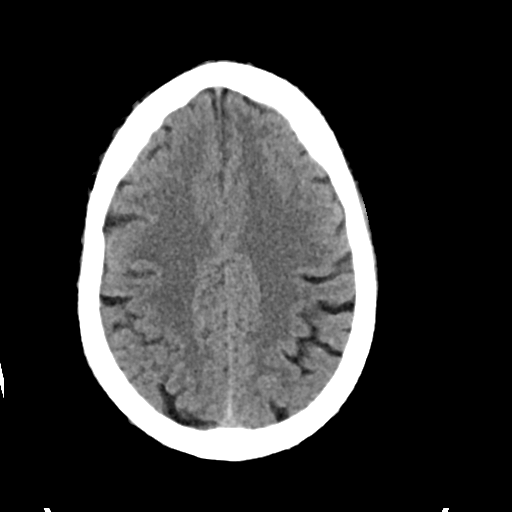
[im 28/32  brain]
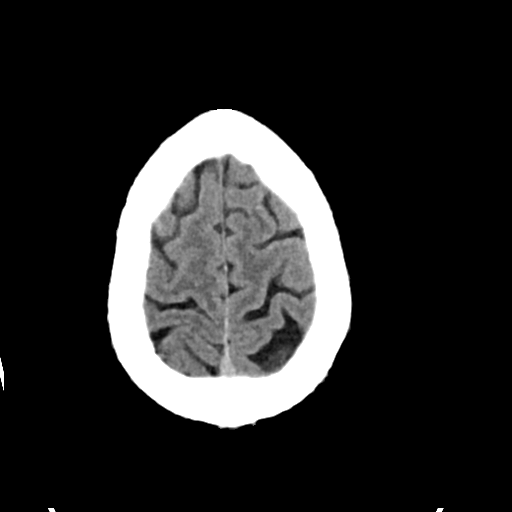

[Series 4: head bone · axial · 0.43mm/px · z∈[-135,-103]mm · 3 of 78 slices shown]
[im 8/78  bone]
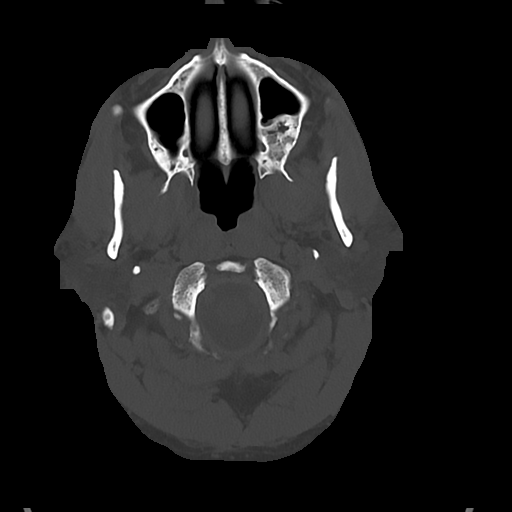
[im 16/78  bone]
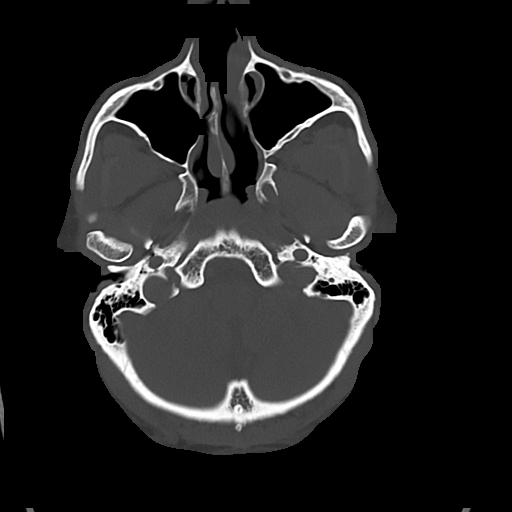
[im 24/78  bone]
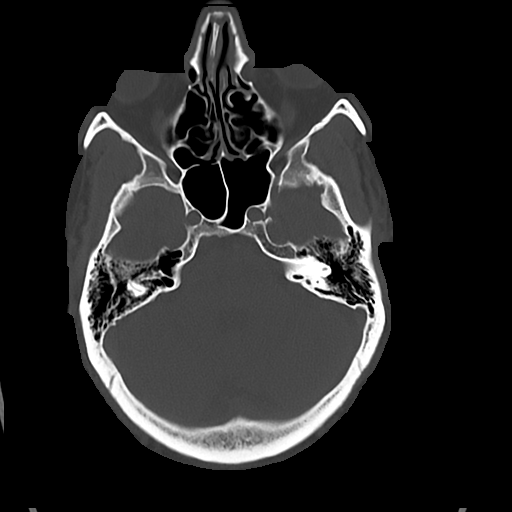

[Series 5: cor soft · coronal · 0.32mm/px · 3 of 75 slices shown]
[im 25/75  brain]
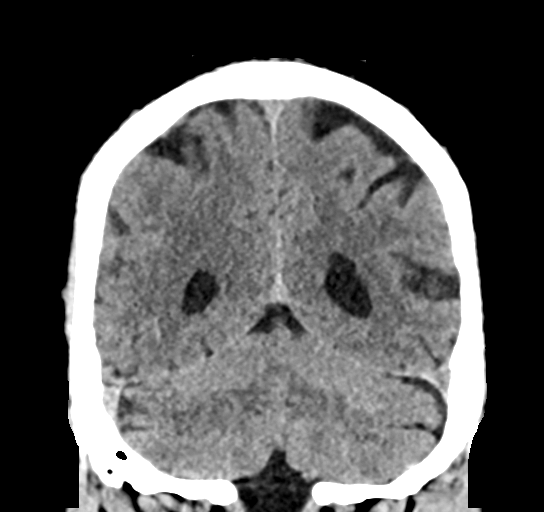
[im 33/75  brain]
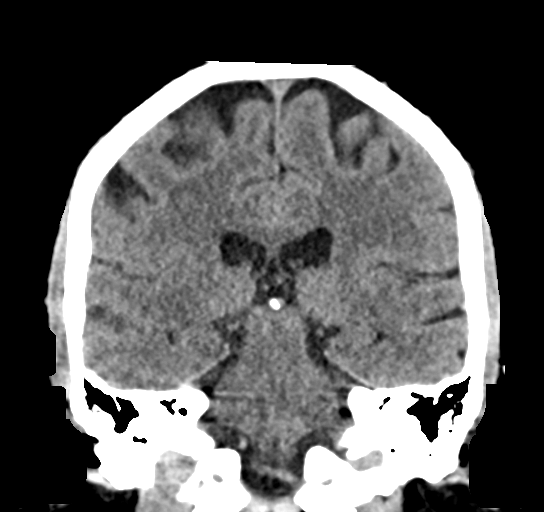
[im 42/75  brain]
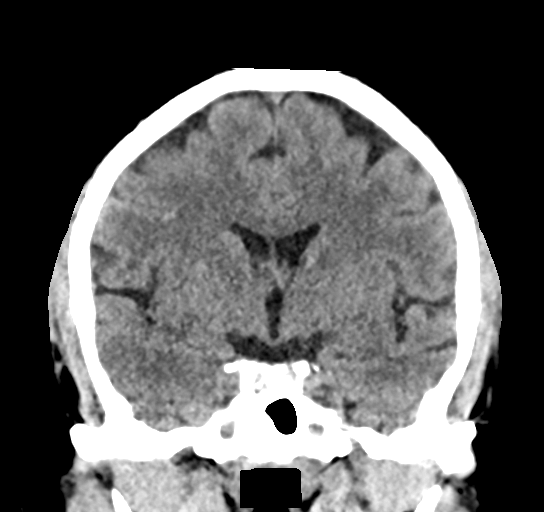

[Series 6: sag soft · sagittal · 0.35mm/px · 3 of 57 slices shown]
[im 19/57  brain]
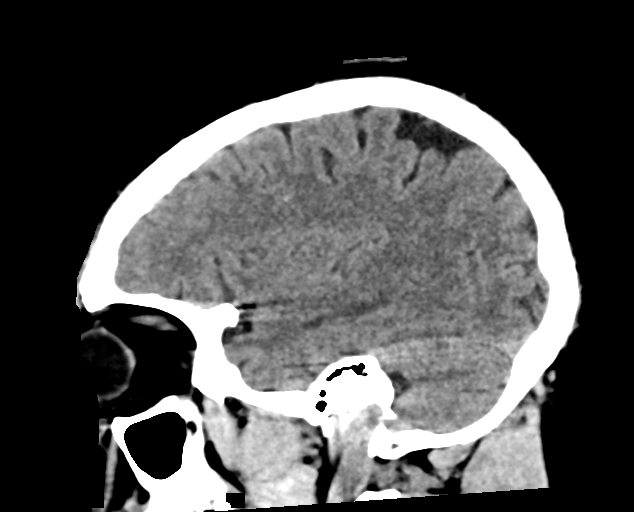
[im 29/57  brain]
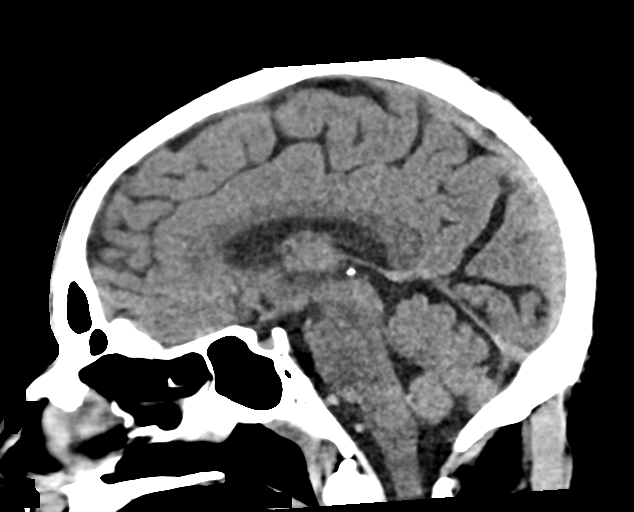
[im 38/57  brain]
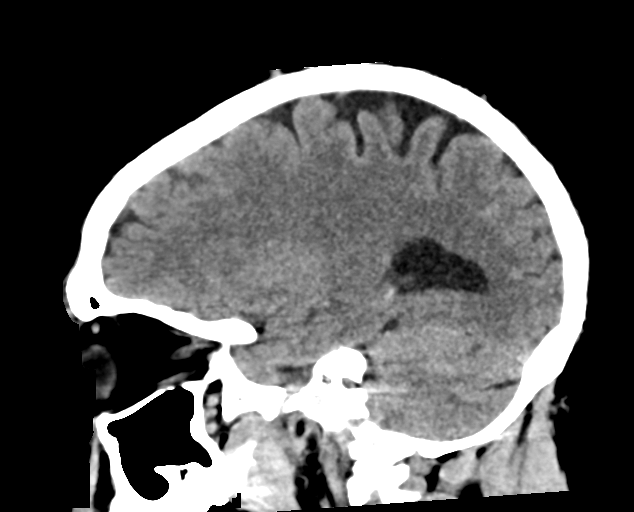

[16 of 47 positions shown; findings below may reference images not displayed]

FINDINGS: Brain: There is no evidence of an acute infarct, intracranial
hemorrhage, mass, midline shift, or extra-axial fluid collection.
Mild cerebral atrophy is most notable in the parietal regions.

Vascular: No hyperdense vessel.

Skull: No fracture or suspicious osseous lesion.

Sinuses/Orbits: No acute finding.

Other: None.

ASPECTS ([place] Stroke Program Early CT Score)

- Ganglionic level infarction (caudate, lentiform nuclei, internal
capsule, insula, M1-M3 cortex): 7

- Supraganglionic infarction (M4-M6 cortex): 3

Total score (0-10 with 10 being normal): 10
IMPRESSION: No evidence of acute intracranial abnormality.  ASPECTS of 10.

These results were communicated to [REDACTED] at [DATE] on
03/15/2021 by text page via the [facility] messaging system.

## 2023-06-02 ENCOUNTER — Encounter (HOSPITAL_COMMUNITY): Payer: Self-pay | Admitting: Emergency Medicine
